# Patient Record
Sex: Female | Born: 1975 | Race: White | Hispanic: No | Smoking: Never smoker
Health system: Southern US, Community
[De-identification: ages and names within clinical notes are randomized; demographics above are authoritative.]

## PROBLEM LIST (undated history)

## (undated) DIAGNOSIS — T8859XA Other complications of anesthesia, initial encounter: Secondary | ICD-10-CM

## (undated) DIAGNOSIS — C50919 Malignant neoplasm of unspecified site of unspecified female breast: Secondary | ICD-10-CM

## (undated) DIAGNOSIS — T4145XA Adverse effect of unspecified anesthetic, initial encounter: Secondary | ICD-10-CM

## (undated) DIAGNOSIS — F419 Anxiety disorder, unspecified: Secondary | ICD-10-CM

## (undated) DIAGNOSIS — J302 Other seasonal allergic rhinitis: Secondary | ICD-10-CM

## (undated) DIAGNOSIS — K219 Gastro-esophageal reflux disease without esophagitis: Secondary | ICD-10-CM

## (undated) HISTORY — PX: WISDOM TOOTH EXTRACTION: SHX21

---
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
## 2005-09-02 HISTORY — PX: APPENDECTOMY: SHX54

## 2011-04-17 HISTORY — PX: PORTACATH PLACEMENT: SHX2246

## 2011-04-22 ENCOUNTER — Other Ambulatory Visit: Payer: Self-pay | Admitting: Oncology

## 2011-04-22 ENCOUNTER — Encounter (HOSPITAL_BASED_OUTPATIENT_CLINIC_OR_DEPARTMENT_OTHER): Payer: BC Managed Care – PPO | Admitting: Oncology

## 2011-04-22 DIAGNOSIS — C50919 Malignant neoplasm of unspecified site of unspecified female breast: Secondary | ICD-10-CM

## 2011-04-22 DIAGNOSIS — C50419 Malignant neoplasm of upper-outer quadrant of unspecified female breast: Secondary | ICD-10-CM

## 2011-04-22 LAB — COMPREHENSIVE METABOLIC PANEL
AST: 13 U/L (ref 0–37)
Albumin: 3.8 g/dL (ref 3.5–5.2)
Alkaline Phosphatase: 75 U/L (ref 39–117)
Potassium: 3.3 mEq/L — ABNORMAL LOW (ref 3.5–5.3)
Sodium: 138 mEq/L (ref 135–145)
Total Protein: 7 g/dL (ref 6.0–8.3)

## 2011-04-22 LAB — CBC WITH DIFFERENTIAL/PLATELET
EOS%: 0.7 % (ref 0.0–7.0)
Eosinophils Absolute: 0.1 10*3/uL (ref 0.0–0.5)
MCH: 30 pg (ref 25.1–34.0)
MCV: 89.4 fL (ref 79.5–101.0)
MONO%: 4.3 % (ref 0.0–14.0)
NEUT#: 6.7 10*3/uL — ABNORMAL HIGH (ref 1.5–6.5)
RBC: 4.66 10*6/uL (ref 3.70–5.45)
RDW: 13.2 % (ref 11.2–14.5)
lymph#: 1.9 10*3/uL (ref 0.9–3.3)

## 2011-05-05 ENCOUNTER — Encounter (INDEPENDENT_AMBULATORY_CARE_PROVIDER_SITE_OTHER): Payer: Self-pay | Admitting: Surgery

## 2011-05-07 ENCOUNTER — Other Ambulatory Visit: Payer: Self-pay | Admitting: Oncology

## 2011-05-07 DIAGNOSIS — C50919 Malignant neoplasm of unspecified site of unspecified female breast: Secondary | ICD-10-CM

## 2011-05-08 ENCOUNTER — Other Ambulatory Visit (INDEPENDENT_AMBULATORY_CARE_PROVIDER_SITE_OTHER): Payer: Self-pay | Admitting: Surgery

## 2011-05-08 ENCOUNTER — Encounter (HOSPITAL_COMMUNITY): Payer: BC Managed Care – PPO

## 2011-05-11 LAB — MRSA CULTURE

## 2011-05-13 ENCOUNTER — Ambulatory Visit (HOSPITAL_COMMUNITY)
Admission: RE | Admit: 2011-05-13 | Discharge: 2011-05-13 | Disposition: A | Discharge status: Home / Self Care | Payer: BC Managed Care – PPO | Source: Ambulatory Visit | Attending: Oncology | Admitting: Oncology

## 2011-05-13 ENCOUNTER — Ambulatory Visit (HOSPITAL_COMMUNITY): Payer: BC Managed Care – PPO

## 2011-05-13 ENCOUNTER — Encounter (HOSPITAL_COMMUNITY): Payer: Self-pay

## 2011-05-13 ENCOUNTER — Encounter (HOSPITAL_COMMUNITY)
Admission: RE | Admit: 2011-05-13 | Discharge: 2011-05-13 | Disposition: A | Discharge status: Home / Self Care | Payer: BC Managed Care – PPO | Source: Ambulatory Visit | Attending: Oncology | Admitting: Oncology

## 2011-05-13 DIAGNOSIS — C773 Secondary and unspecified malignant neoplasm of axilla and upper limb lymph nodes: Secondary | ICD-10-CM | POA: Insufficient documentation

## 2011-05-13 DIAGNOSIS — C50919 Malignant neoplasm of unspecified site of unspecified female breast: Secondary | ICD-10-CM | POA: Insufficient documentation

## 2011-05-13 DIAGNOSIS — Z01810 Encounter for preprocedural cardiovascular examination: Secondary | ICD-10-CM | POA: Insufficient documentation

## 2011-05-13 DIAGNOSIS — Z5111 Encounter for antineoplastic chemotherapy: Secondary | ICD-10-CM

## 2011-05-13 HISTORY — DX: Malignant neoplasm of unspecified site of unspecified female breast: C50.919

## 2011-05-13 MED ORDER — FLUDEOXYGLUCOSE F - 18 (FDG) INJECTION
15.5000 | Freq: Once | INTRAVENOUS | Status: AC | PRN
  Administered 2011-05-13: 15.5 via INTRAVENOUS

## 2011-05-13 MED ORDER — IOHEXOL 300 MG/ML  SOLN
100.0000 mL | Freq: Once | INTRAMUSCULAR | Status: AC | PRN
  Administered 2011-05-13: 100 mL via INTRAVENOUS

## 2011-05-14 ENCOUNTER — Ambulatory Visit (HOSPITAL_COMMUNITY)
Admission: RE | Admit: 2011-05-14 | Discharge: 2011-05-14 | Disposition: A | Discharge status: Home / Self Care | Payer: BC Managed Care – PPO | Source: Ambulatory Visit | Attending: Surgery | Admitting: Surgery

## 2011-05-14 ENCOUNTER — Ambulatory Visit (HOSPITAL_COMMUNITY): Payer: BC Managed Care – PPO

## 2011-05-14 DIAGNOSIS — C50219 Malignant neoplasm of upper-inner quadrant of unspecified female breast: Secondary | ICD-10-CM

## 2011-05-14 DIAGNOSIS — Z79899 Other long term (current) drug therapy: Secondary | ICD-10-CM | POA: Insufficient documentation

## 2011-05-14 DIAGNOSIS — Z01810 Encounter for preprocedural cardiovascular examination: Secondary | ICD-10-CM | POA: Insufficient documentation

## 2011-05-14 DIAGNOSIS — Z01812 Encounter for preprocedural laboratory examination: Secondary | ICD-10-CM | POA: Insufficient documentation

## 2011-05-19 ENCOUNTER — Encounter: Payer: BC Managed Care – PPO | Admitting: Oncology

## 2011-05-19 DIAGNOSIS — C50219 Malignant neoplasm of upper-inner quadrant of unspecified female breast: Secondary | ICD-10-CM

## 2011-05-25 NOTE — Op Note (Signed)
NAME:  Crystal Lane, Crystal Lane NO.:  [id number]  MEDICAL RECORD NO.:  [id number]  LOCATION:  DAYL                         FACILITY:  [facility]  PHYSICIAN:  Sandria Bales. Ezzard Standing, M.D.  DATE OF BIRTH:  1976-04-27  DATE OF PROCEDURE:  05/14/2011                              OPERATIVE REPORT   PREOPERATIVE DIAGNOSIS:  Upper inner quadrant left breast cancer.  POSTOPERATIVE DIAGNOSIS:  Upper inner quadrant left breast cancer.  PROCEDURE:  Right subclavian Power Port placement.  SURGEON:  Sandria Bales. Ezzard Standing, M.D.  FIRST ASSISTANT:  None.  ANESTHESIA:  MAC with about 33 cc of 1% Xylocaine.  COMPLICATIONS:  None.  INDICATIONS FOR PROCEDURE:  Ms. 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  The tumor measures about 4.3 cm on MRI. She is also BRCA1 positive.  The patient wants to try breast conservation therapy.  She has seen Dr. Ruthann Cancer and is planning neoadjuvant chemotherapy supervised by Dr. Ricarda Frame. She is here today for placement of a Port-A-Cath for IV access.  The indications and potential risks of surgery were explained to the patient.  Potential risks include, but are not limited to, bleeding, infection, nerve injury, pneumothorax and thrombosis of the vein.  OPERATIVE NOTE:  The patient was placed in a supine position with a roll under her back.  She was given 1 g of Ancef initially for procedure.  She underwent MAC anesthesia.  Her chest and neck were prepped with ChloraPrep and sterilely draped.    Time-out was held and surgical checklist run.  I accessed the right subclavian vein with a 16 gauge needle on the third stick.  I threaded a guidewire through this into the superior vena cava. I checked the position with fluoroscopy.  I then developed a pocket in the upper inner quadrant of her right breast.  We actually marked her breast preoperatively for  cosmetic reasons.  I tried to follow this preop Marcaine.  I made a pocket for the PowerPort.  I inserted a Bard PowerPort.  I put the reservoir up in the upper inner part of the left breast.  The PowerPort reference number is [id number], lot number [id number].  This was sewn in place with a 3-0 Vicryl suture.  I then threaded the silastic tubing from the upper inner quadrant of the right breast down to the right subclavian stick site and introduced it into the subclavian vein using a 8-French introducer.  I positioned the tip of the junction in the superior vena cava right atrium under fluoroscopy.  I then attached the tubing to the reservoir, flushed the entire unit with about 10 cc of dilute heparin saline (10 units/cc).  I checked position again of the tubing and the tip which all looked good and then flushed with 5 cc of 100 units/cc of concentrated heparin.  The patient tolerated the procedure well.  I then irrigated the wound, closed the subcutaneous tissues with 3-0 Vicryl suture and skin with a 5- 0 running subcutaneous suture and right subclavian stick site with a 5-0 Vicryl and painted with tincture of benzoin and steri-stripped it  with 0.25-inch Steri-Strips.    The patient tolerated the procedure well, was transported to recovery room in good condition.  Chest x-ray is pending at the time of this dictation.   Sandria Bales. Ezzard Standing, M.D., FACS   DHN/MEDQ  D:  05/14/2011  T:  05/14/2011  Job:  [id number]  cc:   Lowella Dell, M.D. Fax: [phone]  Maryln Gottron, M.D. Fax: [phone]  Fredric Dine  Electronically Signed by Ovidio Kin M.D. on 05/25/2011 07:23:56 AM

## 2011-05-26 ENCOUNTER — Encounter (INDEPENDENT_AMBULATORY_CARE_PROVIDER_SITE_OTHER): Payer: Self-pay | Admitting: Surgery

## 2011-05-27 ENCOUNTER — Encounter (INDEPENDENT_AMBULATORY_CARE_PROVIDER_SITE_OTHER): Payer: BC Managed Care – PPO | Admitting: Surgery

## 2011-06-03 ENCOUNTER — Other Ambulatory Visit: Payer: Self-pay | Admitting: Oncology

## 2011-06-03 ENCOUNTER — Encounter (HOSPITAL_BASED_OUTPATIENT_CLINIC_OR_DEPARTMENT_OTHER): Payer: Self-pay | Admitting: Oncology

## 2011-06-03 DIAGNOSIS — Z5111 Encounter for antineoplastic chemotherapy: Secondary | ICD-10-CM

## 2011-06-03 DIAGNOSIS — C50919 Malignant neoplasm of unspecified site of unspecified female breast: Secondary | ICD-10-CM

## 2011-06-03 DIAGNOSIS — C50419 Malignant neoplasm of upper-outer quadrant of unspecified female breast: Secondary | ICD-10-CM

## 2011-06-03 LAB — CBC WITH DIFFERENTIAL/PLATELET
BASO%: 0.4 % (ref 0.0–2.0)
EOS%: 0.5 % (ref 0.0–7.0)
HCT: 41.4 % (ref 34.8–46.6)
LYMPH%: 19.4 % (ref 14.0–49.7)
MCH: 30 pg (ref 25.1–34.0)
MCHC: 34.3 g/dL (ref 31.5–36.0)
MONO%: 6.4 % (ref 0.0–14.0)
NEUT%: 73.3 % (ref 38.4–76.8)
Platelets: 230 10*3/uL (ref 145–400)

## 2011-06-03 LAB — COMPREHENSIVE METABOLIC PANEL
ALT: 11 U/L (ref 0–35)
AST: 14 U/L (ref 0–37)
Albumin: 4 g/dL (ref 3.5–5.2)
Alkaline Phosphatase: 60 U/L (ref 39–117)
BUN: 18 mg/dL (ref 6–23)
CO2: 23 mEq/L (ref 19–32)
Calcium: 8.8 mg/dL (ref 8.4–10.5)
Chloride: 104 mEq/L (ref 96–112)
Creatinine, Ser: 0.63 mg/dL (ref 0.50–1.10)
Glucose, Bld: 91 mg/dL (ref 70–99)
Potassium: 4.1 mEq/L (ref 3.5–5.3)
Sodium: 137 mEq/L (ref 135–145)
Total Bilirubin: 0.4 mg/dL (ref 0.3–1.2)
Total Protein: 6.8 g/dL (ref 6.0–8.3)

## 2011-06-04 ENCOUNTER — Encounter (HOSPITAL_BASED_OUTPATIENT_CLINIC_OR_DEPARTMENT_OTHER): Payer: BC Managed Care – PPO | Admitting: Oncology

## 2011-06-04 DIAGNOSIS — C50419 Malignant neoplasm of upper-outer quadrant of unspecified female breast: Secondary | ICD-10-CM

## 2011-06-09 ENCOUNTER — Other Ambulatory Visit: Payer: Self-pay | Admitting: Oncology

## 2011-06-09 ENCOUNTER — Encounter (HOSPITAL_BASED_OUTPATIENT_CLINIC_OR_DEPARTMENT_OTHER): Payer: BC Managed Care – PPO | Admitting: Oncology

## 2011-06-09 DIAGNOSIS — C50219 Malignant neoplasm of upper-inner quadrant of unspecified female breast: Secondary | ICD-10-CM

## 2011-06-09 DIAGNOSIS — C50912 Malignant neoplasm of unspecified site of left female breast: Secondary | ICD-10-CM

## 2011-06-09 DIAGNOSIS — C50919 Malignant neoplasm of unspecified site of unspecified female breast: Secondary | ICD-10-CM

## 2011-06-09 LAB — CBC WITH DIFFERENTIAL/PLATELET
BASO%: 0.2 % (ref 0.0–2.0)
Eosinophils Absolute: 0.1 10*3/uL (ref 0.0–0.5)
LYMPH%: 16.1 % (ref 14.0–49.7)
MCHC: 34 g/dL (ref 31.5–36.0)
MONO#: 0.1 10*3/uL (ref 0.1–0.9)
MONO%: 1.2 % (ref 0.0–14.0)
NEUT#: 3.5 10*3/uL (ref 1.5–6.5)
Platelets: 159 10*3/uL (ref 145–400)
RBC: 4.56 10*6/uL (ref 3.70–5.45)
RDW: 12.6 % (ref 11.2–14.5)
WBC: 4.3 10*3/uL (ref 3.9–10.3)
nRBC: 0 % (ref 0–0)

## 2011-06-16 ENCOUNTER — Encounter (HOSPITAL_BASED_OUTPATIENT_CLINIC_OR_DEPARTMENT_OTHER): Payer: BC Managed Care – PPO | Admitting: Oncology

## 2011-06-16 ENCOUNTER — Other Ambulatory Visit: Payer: Self-pay | Admitting: Oncology

## 2011-06-16 DIAGNOSIS — Z5111 Encounter for antineoplastic chemotherapy: Secondary | ICD-10-CM

## 2011-06-16 DIAGNOSIS — C50419 Malignant neoplasm of upper-outer quadrant of unspecified female breast: Secondary | ICD-10-CM

## 2011-06-16 DIAGNOSIS — R110 Nausea: Secondary | ICD-10-CM

## 2011-06-16 DIAGNOSIS — C50919 Malignant neoplasm of unspecified site of unspecified female breast: Secondary | ICD-10-CM

## 2011-06-16 DIAGNOSIS — R5381 Other malaise: Secondary | ICD-10-CM

## 2011-06-16 LAB — CBC WITH DIFFERENTIAL/PLATELET
BASO%: 0.6 % (ref 0.0–2.0)
EOS%: 0.1 % (ref 0.0–7.0)
HGB: 12.9 g/dL (ref 11.6–15.9)
MCH: 29.7 pg (ref 25.1–34.0)
MCHC: 34.1 g/dL (ref 31.5–36.0)
MONO#: 0.7 10*3/uL (ref 0.1–0.9)
RDW: 13 % (ref 11.2–14.5)
WBC: 8 10*3/uL (ref 3.9–10.3)
lymph#: 1.6 10*3/uL (ref 0.9–3.3)

## 2011-06-17 ENCOUNTER — Encounter (HOSPITAL_BASED_OUTPATIENT_CLINIC_OR_DEPARTMENT_OTHER): Payer: BC Managed Care – PPO | Admitting: Oncology

## 2011-06-17 DIAGNOSIS — C50419 Malignant neoplasm of upper-outer quadrant of unspecified female breast: Secondary | ICD-10-CM

## 2011-06-23 ENCOUNTER — Other Ambulatory Visit: Payer: Self-pay | Admitting: Oncology

## 2011-06-23 ENCOUNTER — Encounter (HOSPITAL_BASED_OUTPATIENT_CLINIC_OR_DEPARTMENT_OTHER): Payer: BC Managed Care – PPO | Admitting: Oncology

## 2011-06-23 DIAGNOSIS — C50219 Malignant neoplasm of upper-inner quadrant of unspecified female breast: Secondary | ICD-10-CM

## 2011-06-23 DIAGNOSIS — C50919 Malignant neoplasm of unspecified site of unspecified female breast: Secondary | ICD-10-CM

## 2011-06-23 LAB — CBC WITH DIFFERENTIAL/PLATELET
Eosinophils Absolute: 0 10*3/uL (ref 0.0–0.5)
HGB: 14.1 g/dL (ref 11.6–15.9)
MONO#: 0.2 10*3/uL (ref 0.1–0.9)
NEUT#: 2.3 10*3/uL (ref 1.5–6.5)
RBC: 4.8 10*6/uL (ref 3.70–5.45)
RDW: 13.1 % (ref 11.2–14.5)
WBC: 3 10*3/uL — ABNORMAL LOW (ref 3.9–10.3)
lymph#: 0.6 10*3/uL — ABNORMAL LOW (ref 0.9–3.3)
nRBC: 0 % (ref 0–0)

## 2011-06-30 ENCOUNTER — Encounter (HOSPITAL_BASED_OUTPATIENT_CLINIC_OR_DEPARTMENT_OTHER): Payer: BC Managed Care – PPO | Admitting: Oncology

## 2011-06-30 ENCOUNTER — Other Ambulatory Visit: Payer: Self-pay | Admitting: Oncology

## 2011-06-30 DIAGNOSIS — C50219 Malignant neoplasm of upper-inner quadrant of unspecified female breast: Secondary | ICD-10-CM

## 2011-06-30 DIAGNOSIS — Z5111 Encounter for antineoplastic chemotherapy: Secondary | ICD-10-CM

## 2011-06-30 DIAGNOSIS — C50919 Malignant neoplasm of unspecified site of unspecified female breast: Secondary | ICD-10-CM

## 2011-06-30 LAB — CBC WITH DIFFERENTIAL/PLATELET
BASO%: 0.8 % (ref 0.0–2.0)
Basophils Absolute: 0.1 10e3/uL (ref 0.0–0.1)
EOS%: 0.1 % (ref 0.0–7.0)
Eosinophils Absolute: 0 10e3/uL (ref 0.0–0.5)
HCT: 38.5 % (ref 34.8–46.6)
HGB: 13 g/dL (ref 11.6–15.9)
LYMPH%: 12.4 % — ABNORMAL LOW (ref 14.0–49.7)
MCH: 29.7 pg (ref 25.1–34.0)
MCHC: 33.8 g/dL (ref 31.5–36.0)
MCV: 88.1 fL (ref 79.5–101.0)
MONO#: 0.8 10e3/uL (ref 0.1–0.9)
MONO%: 7.3 % (ref 0.0–14.0)
NEUT#: 8.5 10e3/uL — ABNORMAL HIGH (ref 1.5–6.5)
NEUT%: 79.4 % — ABNORMAL HIGH (ref 38.4–76.8)
Platelets: 178 10e3/uL (ref 145–400)
RBC: 4.37 10e6/uL (ref 3.70–5.45)
RDW: 13.6 % (ref 11.2–14.5)
WBC: 10.7 10e3/uL — ABNORMAL HIGH (ref 3.9–10.3)
lymph#: 1.3 10e3/uL (ref 0.9–3.3)
nRBC: 0 % (ref 0–0)

## 2011-07-01 ENCOUNTER — Encounter (HOSPITAL_BASED_OUTPATIENT_CLINIC_OR_DEPARTMENT_OTHER): Payer: BC Managed Care – PPO | Admitting: Oncology

## 2011-07-01 DIAGNOSIS — C50419 Malignant neoplasm of upper-outer quadrant of unspecified female breast: Secondary | ICD-10-CM

## 2011-07-07 ENCOUNTER — Encounter (HOSPITAL_BASED_OUTPATIENT_CLINIC_OR_DEPARTMENT_OTHER): Payer: BC Managed Care – PPO | Admitting: Oncology

## 2011-07-07 ENCOUNTER — Other Ambulatory Visit: Payer: Self-pay | Admitting: Oncology

## 2011-07-07 DIAGNOSIS — C50919 Malignant neoplasm of unspecified site of unspecified female breast: Secondary | ICD-10-CM

## 2011-07-07 DIAGNOSIS — C50419 Malignant neoplasm of upper-outer quadrant of unspecified female breast: Secondary | ICD-10-CM

## 2011-07-07 LAB — CBC WITH DIFFERENTIAL/PLATELET
Basophils Absolute: 0 10*3/uL (ref 0.0–0.1)
Eosinophils Absolute: 0 10*3/uL (ref 0.0–0.5)
HGB: 12.7 g/dL (ref 11.6–15.9)
MCV: 87.1 fL (ref 79.5–101.0)
MONO%: 2.2 % (ref 0.0–14.0)
NEUT#: 3.3 10*3/uL (ref 1.5–6.5)
RDW: 13.3 % (ref 11.2–14.5)
lymph#: 0.4 10*3/uL — ABNORMAL LOW (ref 0.9–3.3)

## 2011-07-14 ENCOUNTER — Encounter (HOSPITAL_BASED_OUTPATIENT_CLINIC_OR_DEPARTMENT_OTHER): Payer: BC Managed Care – PPO | Admitting: Oncology

## 2011-07-14 ENCOUNTER — Other Ambulatory Visit: Payer: Self-pay | Admitting: Oncology

## 2011-07-14 DIAGNOSIS — C50919 Malignant neoplasm of unspecified site of unspecified female breast: Secondary | ICD-10-CM

## 2011-07-14 DIAGNOSIS — Z5111 Encounter for antineoplastic chemotherapy: Secondary | ICD-10-CM

## 2011-07-14 DIAGNOSIS — C50219 Malignant neoplasm of upper-inner quadrant of unspecified female breast: Secondary | ICD-10-CM

## 2011-07-14 LAB — CBC WITH DIFFERENTIAL/PLATELET
Basophils Absolute: 0 10*3/uL (ref 0.0–0.1)
Eosinophils Absolute: 0 10*3/uL (ref 0.0–0.5)
HGB: 11.9 g/dL (ref 11.6–15.9)
LYMPH%: 16.5 % (ref 14.0–49.7)
MCV: 86.3 fL (ref 79.5–101.0)
MONO%: 11.9 % (ref 0.0–14.0)
NEUT#: 4.3 10*3/uL (ref 1.5–6.5)
NEUT%: 70.9 % (ref 38.4–76.8)
Platelets: 236 10*3/uL (ref 145–400)

## 2011-07-15 ENCOUNTER — Encounter (HOSPITAL_BASED_OUTPATIENT_CLINIC_OR_DEPARTMENT_OTHER): Payer: BC Managed Care – PPO | Admitting: Oncology

## 2011-07-15 DIAGNOSIS — Z5189 Encounter for other specified aftercare: Secondary | ICD-10-CM

## 2011-07-15 DIAGNOSIS — C50219 Malignant neoplasm of upper-inner quadrant of unspecified female breast: Secondary | ICD-10-CM

## 2011-07-21 ENCOUNTER — Encounter: Payer: BC Managed Care – PPO | Admitting: Oncology

## 2011-07-21 ENCOUNTER — Ambulatory Visit
Admission: RE | Admit: 2011-07-21 | Discharge: 2011-07-21 | Disposition: A | Discharge status: Home / Self Care | Payer: BC Managed Care – PPO | Source: Ambulatory Visit | Attending: Oncology | Admitting: Oncology

## 2011-07-21 DIAGNOSIS — C50912 Malignant neoplasm of unspecified site of left female breast: Secondary | ICD-10-CM

## 2011-07-21 MED ORDER — GADOBENATE DIMEGLUMINE 529 MG/ML IV SOLN
14.0000 mL | Freq: Once | INTRAVENOUS | Status: AC | PRN
  Administered 2011-07-21: 14 mL via INTRAVENOUS

## 2011-07-22 ENCOUNTER — Ambulatory Visit (INDEPENDENT_AMBULATORY_CARE_PROVIDER_SITE_OTHER): Payer: Self-pay | Admitting: Surgery

## 2011-07-22 ENCOUNTER — Other Ambulatory Visit: Payer: Self-pay | Admitting: Oncology

## 2011-07-22 ENCOUNTER — Other Ambulatory Visit (INDEPENDENT_AMBULATORY_CARE_PROVIDER_SITE_OTHER): Payer: Self-pay | Admitting: Surgery

## 2011-07-22 ENCOUNTER — Encounter (HOSPITAL_BASED_OUTPATIENT_CLINIC_OR_DEPARTMENT_OTHER): Payer: BC Managed Care – PPO | Admitting: Oncology

## 2011-07-22 ENCOUNTER — Encounter (INDEPENDENT_AMBULATORY_CARE_PROVIDER_SITE_OTHER): Payer: Self-pay | Admitting: Surgery

## 2011-07-22 VITALS — BP 106/76 | HR 60 | Temp 96.8°F | Ht 62.0 in | Wt 168.1 lb

## 2011-07-22 DIAGNOSIS — C50919 Malignant neoplasm of unspecified site of unspecified female breast: Secondary | ICD-10-CM

## 2011-07-22 DIAGNOSIS — C50212 Malignant neoplasm of upper-inner quadrant of left female breast: Secondary | ICD-10-CM | POA: Insufficient documentation

## 2011-07-22 DIAGNOSIS — Z79899 Other long term (current) drug therapy: Secondary | ICD-10-CM

## 2011-07-22 DIAGNOSIS — C50419 Malignant neoplasm of upper-outer quadrant of unspecified female breast: Secondary | ICD-10-CM

## 2011-07-22 DIAGNOSIS — N63 Unspecified lump in unspecified breast: Secondary | ICD-10-CM

## 2011-07-22 DIAGNOSIS — R42 Dizziness and giddiness: Secondary | ICD-10-CM

## 2011-07-22 DIAGNOSIS — R5381 Other malaise: Secondary | ICD-10-CM

## 2011-07-22 LAB — CBC WITH DIFFERENTIAL/PLATELET
BASO%: 0.5 % (ref 0.0–2.0)
EOS%: 0.1 % (ref 0.0–7.0)
HCT: 34.5 % — ABNORMAL LOW (ref 34.8–46.6)
LYMPH%: 13 % — ABNORMAL LOW (ref 14.0–49.7)
MCH: 30.6 pg (ref 25.1–34.0)
MCHC: 34.8 g/dL (ref 31.5–36.0)
NEUT%: 78.5 % — ABNORMAL HIGH (ref 38.4–76.8)
Platelets: 214 10*3/uL (ref 145–400)

## 2011-07-22 LAB — COMPREHENSIVE METABOLIC PANEL
ALT: 10 U/L (ref 0–35)
AST: 9 U/L (ref 0–37)
BUN: 16 mg/dL (ref 6–23)
CO2: 27 mEq/L (ref 19–32)
Creatinine, Ser: 0.61 mg/dL (ref 0.50–1.10)
Total Bilirubin: 0.2 mg/dL — ABNORMAL LOW (ref 0.3–1.2)

## 2011-07-22 NOTE — Progress Notes (Addendum)
ASSESSMENT AND PLAN: 1.  Left breast cancer, upper inner quadrant.    T2, N0.   BRCA 1 positive.  Triple Negative.  Getting neoadjuvant therapy by Crystal Lane.  Anticipate completion with Taxol.  I talked to Crystal Lane today.  Tumor size decreased by 1/3.  Patient never had a clip placed in her breast.  She has had such a good response to chemotherapy, I think a clip placement is important in case she gets a CR.  Discussed with patient.   Crystal Lane called me and said there was some confusion about the clip placement.  I called and spoke to the patient.  Crystal Lane 07/24/11]  I will see her back at the end of the next round of chemotx to plan lumpectomy and SLNBx.  2.  Power port placed 05/14/2011.  HISTORY OF PRESENT ILLNESS:  Crystal Lane is a 35 y.o. (DOB: 10-03-1976)  white female who is a patient of Crystal Lane,Crystal Lane, Crystal Lane and comes to me today for followup of neoadjuvant chemotherapy for a left breast cancer.  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 She originally saw Crystal Lane for oncology, Crystal Lane of [facility] for surgery, and Crystal Lane for radiation oncology.  But she decided to have her care in [place] and has been seen by our medical community.  She has seen Crystal Lane for medical oncology and Crystal Lane for radiation oncology.  She tried to harvest some ovaries with the assistance of Crystal Lane in June 2012, but this did not work.  The tumor originally measured about 4.3 cm on MRI dated 04/01/2011. She is also BRCA1 positive. The patient wants to try breast conservation therapy.  She originally underwent a biopsy 03/27/2011 which showed Invasive ductal carcinoma read by Crystal Lane.  The tumor is triple negative.  She is accompanied with her mom today.  She has completed 4 cycles of AC.  She can't feel the tumor  anymore.  PHYSICAL EXAM: BP 106/76  Pulse 60  Temp 96.8 F (36 C)  Ht 5\' 2"  (1.575 m)  Wt 168 lb 2 oz (76.261 kg)  BMI 30.75 kg/m2  HEENT:  Pupils equal.  Dentition good.  No injury. NECK:  Supple.  No thyroid mass. LYMPH NODES:  No cervical, supraclavicular, or axillary adenopathy. BREASTS -  RIGHT:  No palpable mass or nodule.  No nipple discharge.  Port in upper inner right breast.   LEFT:  Left breast tumor no longer palpable.  There may be some minimal thickening in the upper inner quadrant. No nipple discharge. UPPER EXTREMITIES:  No evidence of lymphedema.  Ultrasound in office:  Residual 1.3 x 1.0 cm tumor in UIQ of left breast.  Patient saw pictures.  DATA REVIEWED: MRI images reviewed, but report pending.  I have a call into Crystal Lane.

## 2011-07-22 NOTE — Patient Instructions (Signed)
See me back at the end of the next round of chemotherapy.

## 2011-07-27 ENCOUNTER — Ambulatory Visit
Admission: RE | Admit: 2011-07-27 | Discharge: 2011-07-27 | Disposition: A | Discharge status: Home / Self Care | Payer: BC Managed Care – PPO | Source: Ambulatory Visit | Attending: Surgery | Admitting: Surgery

## 2011-07-27 DIAGNOSIS — N63 Unspecified lump in unspecified breast: Secondary | ICD-10-CM

## 2011-08-07 ENCOUNTER — Encounter (HOSPITAL_BASED_OUTPATIENT_CLINIC_OR_DEPARTMENT_OTHER): Payer: BC Managed Care – PPO | Admitting: Oncology

## 2011-08-07 ENCOUNTER — Other Ambulatory Visit: Payer: Self-pay | Admitting: Oncology

## 2011-08-07 DIAGNOSIS — C50419 Malignant neoplasm of upper-outer quadrant of unspecified female breast: Secondary | ICD-10-CM

## 2011-08-07 DIAGNOSIS — R5381 Other malaise: Secondary | ICD-10-CM

## 2011-08-07 DIAGNOSIS — R110 Nausea: Secondary | ICD-10-CM

## 2011-08-07 DIAGNOSIS — C50219 Malignant neoplasm of upper-inner quadrant of unspecified female breast: Secondary | ICD-10-CM

## 2011-08-07 DIAGNOSIS — Z5111 Encounter for antineoplastic chemotherapy: Secondary | ICD-10-CM

## 2011-08-07 LAB — CBC WITH DIFFERENTIAL/PLATELET
BASO%: 0.2 % (ref 0.0–2.0)
Basophils Absolute: 0 10*3/uL (ref 0.0–0.1)
Eosinophils Absolute: 0 10*3/uL (ref 0.0–0.5)
HCT: 34.3 % — ABNORMAL LOW (ref 34.8–46.6)
HGB: 11.6 g/dL (ref 11.6–15.9)
LYMPH%: 12.3 % — ABNORMAL LOW (ref 14.0–49.7)
MCHC: 33.8 g/dL (ref 31.5–36.0)
MONO#: 0.8 10*3/uL (ref 0.1–0.9)
NEUT%: 71.6 % (ref 38.4–76.8)
Platelets: 314 10*3/uL (ref 145–400)
WBC: 5.1 10*3/uL (ref 3.9–10.3)

## 2011-08-13 ENCOUNTER — Other Ambulatory Visit: Payer: Self-pay | Admitting: Oncology

## 2011-08-13 ENCOUNTER — Encounter (HOSPITAL_BASED_OUTPATIENT_CLINIC_OR_DEPARTMENT_OTHER): Payer: BC Managed Care – PPO | Admitting: Oncology

## 2011-08-13 DIAGNOSIS — Z5111 Encounter for antineoplastic chemotherapy: Secondary | ICD-10-CM

## 2011-08-13 DIAGNOSIS — C50419 Malignant neoplasm of upper-outer quadrant of unspecified female breast: Secondary | ICD-10-CM

## 2011-08-13 DIAGNOSIS — R5383 Other fatigue: Secondary | ICD-10-CM

## 2011-08-13 DIAGNOSIS — C50919 Malignant neoplasm of unspecified site of unspecified female breast: Secondary | ICD-10-CM

## 2011-08-13 DIAGNOSIS — R6889 Other general symptoms and signs: Secondary | ICD-10-CM

## 2011-08-13 LAB — CBC WITH DIFFERENTIAL/PLATELET
BASO%: 0.8 % (ref 0.0–2.0)
Basophils Absolute: 0.1 10*3/uL (ref 0.0–0.1)
EOS%: 0.8 % (ref 0.0–7.0)
HCT: 33.6 % — ABNORMAL LOW (ref 34.8–46.6)
LYMPH%: 12.7 % — ABNORMAL LOW (ref 14.0–49.7)
MCH: 30.4 pg (ref 25.1–34.0)
MCHC: 34.2 g/dL (ref 31.5–36.0)
MCV: 88.9 fL (ref 79.5–101.0)
MONO%: 6.3 % (ref 0.0–14.0)
NEUT%: 79.4 % — ABNORMAL HIGH (ref 38.4–76.8)
Platelets: 287 10*3/uL (ref 145–400)

## 2011-08-14 ENCOUNTER — Encounter (HOSPITAL_BASED_OUTPATIENT_CLINIC_OR_DEPARTMENT_OTHER): Payer: BC Managed Care – PPO | Admitting: Oncology

## 2011-08-14 DIAGNOSIS — Z5111 Encounter for antineoplastic chemotherapy: Secondary | ICD-10-CM

## 2011-08-14 DIAGNOSIS — C50219 Malignant neoplasm of upper-inner quadrant of unspecified female breast: Secondary | ICD-10-CM

## 2011-08-20 ENCOUNTER — Other Ambulatory Visit: Payer: Self-pay | Admitting: Oncology

## 2011-08-20 ENCOUNTER — Encounter (HOSPITAL_BASED_OUTPATIENT_CLINIC_OR_DEPARTMENT_OTHER): Payer: BC Managed Care – PPO | Admitting: Oncology

## 2011-08-20 DIAGNOSIS — R110 Nausea: Secondary | ICD-10-CM

## 2011-08-20 DIAGNOSIS — R5381 Other malaise: Secondary | ICD-10-CM

## 2011-08-20 DIAGNOSIS — C50419 Malignant neoplasm of upper-outer quadrant of unspecified female breast: Secondary | ICD-10-CM

## 2011-08-20 DIAGNOSIS — Z5111 Encounter for antineoplastic chemotherapy: Secondary | ICD-10-CM

## 2011-08-20 LAB — CBC WITH DIFFERENTIAL/PLATELET
EOS%: 1.2 % (ref 0.0–7.0)
LYMPH%: 18 % (ref 14.0–49.7)
MCH: 30.9 pg (ref 25.1–34.0)
MCV: 90.3 fL (ref 79.5–101.0)
MONO%: 5.4 % (ref 0.0–14.0)
Platelets: 294 10*3/uL (ref 145–400)
RBC: 3.49 10*6/uL — ABNORMAL LOW (ref 3.70–5.45)
RDW: 16.2 % — ABNORMAL HIGH (ref 11.2–14.5)
nRBC: 0 % (ref 0–0)

## 2011-08-27 ENCOUNTER — Other Ambulatory Visit: Payer: Self-pay | Admitting: Oncology

## 2011-08-27 ENCOUNTER — Encounter (HOSPITAL_BASED_OUTPATIENT_CLINIC_OR_DEPARTMENT_OTHER): Payer: BC Managed Care – PPO | Admitting: Oncology

## 2011-08-27 DIAGNOSIS — G620 Drug-induced polyneuropathy: Secondary | ICD-10-CM

## 2011-08-27 DIAGNOSIS — T451X5A Adverse effect of antineoplastic and immunosuppressive drugs, initial encounter: Secondary | ICD-10-CM

## 2011-08-27 DIAGNOSIS — R110 Nausea: Secondary | ICD-10-CM

## 2011-08-27 DIAGNOSIS — Z5111 Encounter for antineoplastic chemotherapy: Secondary | ICD-10-CM

## 2011-08-27 DIAGNOSIS — C50219 Malignant neoplasm of upper-inner quadrant of unspecified female breast: Secondary | ICD-10-CM

## 2011-08-27 DIAGNOSIS — R5381 Other malaise: Secondary | ICD-10-CM

## 2011-08-27 DIAGNOSIS — Z79899 Other long term (current) drug therapy: Secondary | ICD-10-CM

## 2011-08-27 DIAGNOSIS — C50419 Malignant neoplasm of upper-outer quadrant of unspecified female breast: Secondary | ICD-10-CM

## 2011-08-27 LAB — CBC WITH DIFFERENTIAL/PLATELET
BASO%: 0.7 % (ref 0.0–2.0)
EOS%: 1.5 % (ref 0.0–7.0)
HCT: 30.9 % — ABNORMAL LOW (ref 34.8–46.6)
LYMPH%: 18.9 % (ref 14.0–49.7)
MCH: 31.3 pg (ref 25.1–34.0)
MCHC: 34.3 g/dL (ref 31.5–36.0)
MONO#: 0.2 10*3/uL (ref 0.1–0.9)
NEUT%: 74.1 % (ref 38.4–76.8)
Platelets: 325 10*3/uL (ref 145–400)
RBC: 3.39 10*6/uL — ABNORMAL LOW (ref 3.70–5.45)
WBC: 4.6 10*3/uL (ref 3.9–10.3)
lymph#: 0.9 10*3/uL (ref 0.9–3.3)
nRBC: 0 % (ref 0–0)

## 2011-09-03 ENCOUNTER — Encounter (HOSPITAL_BASED_OUTPATIENT_CLINIC_OR_DEPARTMENT_OTHER): Payer: BC Managed Care – PPO | Admitting: Oncology

## 2011-09-03 ENCOUNTER — Other Ambulatory Visit: Payer: Self-pay | Admitting: Oncology

## 2011-09-03 DIAGNOSIS — R110 Nausea: Secondary | ICD-10-CM

## 2011-09-03 DIAGNOSIS — C50419 Malignant neoplasm of upper-outer quadrant of unspecified female breast: Secondary | ICD-10-CM

## 2011-09-03 DIAGNOSIS — R5381 Other malaise: Secondary | ICD-10-CM

## 2011-09-03 DIAGNOSIS — Z5111 Encounter for antineoplastic chemotherapy: Secondary | ICD-10-CM

## 2011-09-03 DIAGNOSIS — G609 Hereditary and idiopathic neuropathy, unspecified: Secondary | ICD-10-CM

## 2011-09-03 LAB — CBC WITH DIFFERENTIAL/PLATELET
BASO%: 0.9 % (ref 0.0–2.0)
EOS%: 1.1 % (ref 0.0–7.0)
HGB: 11.9 g/dL (ref 11.6–15.9)
MCH: 31.2 pg (ref 25.1–34.0)
MCV: 92.9 fL (ref 79.5–101.0)
MONO%: 11.1 % (ref 0.0–14.0)
RBC: 3.82 10*6/uL (ref 3.70–5.45)
RDW: 15.7 % — ABNORMAL HIGH (ref 11.2–14.5)
lymph#: 0.9 10*3/uL (ref 0.9–3.3)
nRBC: 0 % (ref 0–0)

## 2011-09-04 ENCOUNTER — Other Ambulatory Visit: Payer: Self-pay | Admitting: Oncology

## 2011-09-04 DIAGNOSIS — C50912 Malignant neoplasm of unspecified site of left female breast: Secondary | ICD-10-CM

## 2011-09-09 ENCOUNTER — Encounter (INDEPENDENT_AMBULATORY_CARE_PROVIDER_SITE_OTHER): Payer: Self-pay

## 2011-09-11 ENCOUNTER — Encounter (INDEPENDENT_AMBULATORY_CARE_PROVIDER_SITE_OTHER): Payer: Self-pay | Admitting: Surgery

## 2011-09-11 ENCOUNTER — Ambulatory Visit (INDEPENDENT_AMBULATORY_CARE_PROVIDER_SITE_OTHER): Payer: BC Managed Care – PPO | Admitting: Surgery

## 2011-09-11 VITALS — BP 110/74 | HR 72 | Temp 97.6°F | Resp 16 | Ht 62.0 in | Wt 179.4 lb

## 2011-09-11 DIAGNOSIS — C50919 Malignant neoplasm of unspecified site of unspecified female breast: Secondary | ICD-10-CM

## 2011-09-11 NOTE — Progress Notes (Addendum)
ASSESSMENT AND PLAN: 1.  Left breast cancer, upper inner quadrant.    T2, N0.   BRCA 1 positive.  Triple Negative.  Note, on [place], the tumor looks close to the chest wall.  The clip may be a little more superficial.  Finished neoadjuvant therapy by Dr. Darnelle Lane.  Developed neuropathy with Taxol.  I talked to Dr. Darnelle Lane today.   She still has numbness from Taxol.  Tumor size decreased by 1/3.  Clip placed in left breast - 07/27/2011.   I have discussed the options for breast cancer treatment with the patient.  The patient is ready for lumpectomy, SLNBx, and power port removal. I discussed needle localization, injection for SLNBx.  At first she did not want any clips left in her breast, but she said it is okay if I leave marking clips for radiation oncology.  She had some trouble with the skin prep and nausea last time. The risks of surgery include, but are not limited to, bleeding, infection, the need for further surgery, and nerve injury.  She is accompanied with her mother.  The patient has been given literature on the treatment of breast cancer.  2.  Power port placed 05/14/2011.  Patient will do what we want.  [I talked to Dr Crystal Lane and we think it is best to leave the port until the final path is in.  DN 11/16/2011] 3.  BRCA 1 positive.  Wants to try breast conservation. 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  5.  Has started fund raising with another patient of mine, Crystal Lane [phone]).  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  HISTORY OF PRESENT ILLNESS:  Crystal Lane is a 35 y.o. (DOB: July 17, 1976)  white female who is a patient of JUDGE,ERIN, FNP, FNP and comes to me today for followup of neoadjuvant chemotherapy for a left breast cancer.  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 She originally saw Dr. Mathis Lane for oncology, Dr. Gennette Lane of [facility] for  surgery, and Dr. Graciela Lane for radiation oncology.  But she decided to have her care in [place] and has been seen by our medical community.  She has seen Dr. Patrice Lane for medical oncology and Dr. Jamie Lane for radiation oncology.  She tried to harvest some ovaries with the assistance of Dr. Melvenia Lane in June 2012, but this did not work.  The tumor originally measured about 4.3 cm on MRI dated 04/01/2011. She is also BRCA1 positive. The patient wants to try breast conservation therapy.  She originally underwent a biopsy 03/27/2011 which showed Invasive ductal carcinoma read by Dr. Katheran Lane.  The tumor is triple negative.  She is accompanied with her mom today.  She has finished chemotx (Taxol).  She stopped prematurely because of worsening neuropathy.  She is ready for surgery.  She can't feel the tumor anymore.  She discussed with me some of her efforts at fund raising for breast cancer.  PHYSICAL EXAM: BP 110/74  Pulse 72  Temp(Src) 97.6 F (36.4 C) (Temporal)  Resp 16  Ht 5\' 2"  (1.575 m)  Wt 179 lb 6.4 oz (81.375 kg)  BMI 32.81 kg/m2  HEENT:  Pupils equal.  Dentition good.  No injury. NECK:  Supple.  No thyroid mass. LYMPH NODES:  No cervical, supraclavicular, or axillary adenopathy. BREASTS -  RIGHT:  No palpable mass or nodule.  No nipple discharge.  Port in upper inner right breast.   LEFT:  Left breast tumor no longer palpable.  There may be some minimal thickening in the upper inner quadrant. No nipple discharge. UPPER EXTREMITIES:  No evidence of lymphedema.  Ultrasound in office:  Residual 1.0 cm tumor in UIQ of left breast.  The tumor seems close to the chest wall.  I thought I saw the clip. Patient saw pictures.  DATA REVIEWED: MRI images reviewed.  I repeated an [place] today.  The tumor is as small as before, if not smaller.

## 2011-09-15 ENCOUNTER — Other Ambulatory Visit (INDEPENDENT_AMBULATORY_CARE_PROVIDER_SITE_OTHER): Payer: Self-pay | Admitting: Surgery

## 2011-09-15 DIAGNOSIS — C50912 Malignant neoplasm of unspecified site of left female breast: Secondary | ICD-10-CM

## 2011-09-22 ENCOUNTER — Encounter (HOSPITAL_BASED_OUTPATIENT_CLINIC_OR_DEPARTMENT_OTHER): Payer: Self-pay | Admitting: *Deleted

## 2011-09-24 ENCOUNTER — Ambulatory Visit: Payer: BC Managed Care – PPO

## 2011-09-24 ENCOUNTER — Ambulatory Visit: Payer: BC Managed Care – PPO | Admitting: Radiation Oncology

## 2011-09-25 ENCOUNTER — Encounter (HOSPITAL_BASED_OUTPATIENT_CLINIC_OR_DEPARTMENT_OTHER)
Admission: RE | Admit: 2011-09-25 | Discharge: 2011-09-25 | Disposition: A | Discharge status: Home / Self Care | Payer: BC Managed Care – PPO | Source: Ambulatory Visit | Attending: Surgery | Admitting: Surgery

## 2011-09-25 LAB — COMPREHENSIVE METABOLIC PANEL
ALT: 26 U/L (ref 0–35)
AST: 22 U/L (ref 0–37)
Albumin: 3.6 g/dL (ref 3.5–5.2)
Calcium: 9.3 mg/dL (ref 8.4–10.5)
Creatinine, Ser: 0.65 mg/dL (ref 0.50–1.10)
GFR calc non Af Amer: >90 mL/min (ref >90)
Sodium: 141 mEq/L (ref 135–145)
Total Protein: 7.1 g/dL (ref 6.0–8.3)

## 2011-09-25 LAB — DIFFERENTIAL
Basophils Absolute: 0 10*3/uL (ref 0.0–0.1)
Basophils Relative: 0 % (ref 0–1)
Eosinophils Absolute: 0.1 10*3/uL (ref 0.0–0.7)
Eosinophils Relative: 1 % (ref 0–5)
Lymphocytes Relative: 13 % (ref 12–46)
Monocytes Absolute: 0.4 10*3/uL (ref 0.1–1.0)

## 2011-09-25 LAB — CBC
HCT: 39.1 % (ref 36.0–46.0)
Hemoglobin: 12.8 g/dL (ref 12.0–15.0)
MCH: 30.6 pg (ref 26.0–34.0)
MCHC: 32.7 g/dL (ref 30.0–36.0)
MCV: 93.5 fL (ref 78.0–100.0)
Platelets: 303 K/uL (ref 150–400)
RBC: 4.18 MIL/uL (ref 3.87–5.11)
RDW: 12.8 % (ref 11.5–15.5)
WBC: 7.1 K/uL (ref 4.0–10.5)

## 2011-09-28 NOTE — Pre-Procedure Instructions (Signed)
Received call from Sarah at [facility] - Dr. Ezzard Standing has reviewed pt's labs. and pt. is OK for surgery.

## 2011-09-28 NOTE — Progress Notes (Signed)
Quick Note:  These labs are OK for surgery. ______ 

## 2011-09-28 NOTE — Progress Notes (Signed)
I notified Olegario Messier at [facility] that the labs are ok for surgery.

## 2011-09-29 ENCOUNTER — Ambulatory Visit (HOSPITAL_BASED_OUTPATIENT_CLINIC_OR_DEPARTMENT_OTHER): Payer: BC Managed Care – PPO | Admitting: *Deleted

## 2011-09-29 ENCOUNTER — Other Ambulatory Visit (INDEPENDENT_AMBULATORY_CARE_PROVIDER_SITE_OTHER): Payer: Self-pay | Admitting: Surgery

## 2011-09-29 ENCOUNTER — Ambulatory Visit (HOSPITAL_COMMUNITY)
Admission: RE | Admit: 2011-09-29 | Discharge: 2011-09-29 | Disposition: A | Discharge status: Home / Self Care | Payer: BC Managed Care – PPO | Source: Ambulatory Visit | Attending: Surgery | Admitting: Surgery

## 2011-09-29 ENCOUNTER — Encounter (HOSPITAL_BASED_OUTPATIENT_CLINIC_OR_DEPARTMENT_OTHER): Payer: Self-pay | Admitting: *Deleted

## 2011-09-29 ENCOUNTER — Ambulatory Visit
Admission: RE | Admit: 2011-09-29 | Discharge: 2011-09-29 | Disposition: A | Discharge status: Home / Self Care | Payer: BC Managed Care – PPO | Source: Ambulatory Visit | Attending: Surgery | Admitting: Surgery

## 2011-09-29 ENCOUNTER — Ambulatory Visit (HOSPITAL_BASED_OUTPATIENT_CLINIC_OR_DEPARTMENT_OTHER)
Admission: RE | Admit: 2011-09-29 | Discharge: 2011-09-29 | Disposition: A | Discharge status: Home / Self Care | Payer: BC Managed Care – PPO | Source: Ambulatory Visit | Attending: Surgery | Admitting: Surgery

## 2011-09-29 ENCOUNTER — Encounter (HOSPITAL_BASED_OUTPATIENT_CLINIC_OR_DEPARTMENT_OTHER)
Admission: RE | Disposition: A | Discharge status: Home / Self Care | Payer: Self-pay | Source: Ambulatory Visit | Attending: Surgery

## 2011-09-29 DIAGNOSIS — Z01812 Encounter for preprocedural laboratory examination: Secondary | ICD-10-CM | POA: Insufficient documentation

## 2011-09-29 DIAGNOSIS — C50919 Malignant neoplasm of unspecified site of unspecified female breast: Secondary | ICD-10-CM

## 2011-09-29 DIAGNOSIS — C50219 Malignant neoplasm of upper-inner quadrant of unspecified female breast: Secondary | ICD-10-CM | POA: Insufficient documentation

## 2011-09-29 DIAGNOSIS — C50912 Malignant neoplasm of unspecified site of left female breast: Secondary | ICD-10-CM

## 2011-09-29 DIAGNOSIS — T451X5A Adverse effect of antineoplastic and immunosuppressive drugs, initial encounter: Secondary | ICD-10-CM | POA: Insufficient documentation

## 2011-09-29 DIAGNOSIS — G622 Polyneuropathy due to other toxic agents: Secondary | ICD-10-CM | POA: Insufficient documentation

## 2011-09-29 HISTORY — DX: Other complications of anesthesia, initial encounter: T88.59XA

## 2011-09-29 HISTORY — PX: BREAST LUMPECTOMY: SHX2

## 2011-09-29 HISTORY — DX: Other seasonal allergic rhinitis: J30.2

## 2011-09-29 HISTORY — DX: Anxiety disorder, unspecified: F41.9

## 2011-09-29 HISTORY — DX: Adverse effect of unspecified anesthetic, initial encounter: T41.45XA

## 2011-09-29 HISTORY — PX: BREAST LUMPECTOMY W/ NEEDLE LOCALIZATION: SHX1266

## 2011-09-29 SURGERY — BREAST LUMPECTOMY WITH EXCISION OF SENTINEL NODE
Anesthesia: General | Site: Breast | Wound class: Clean

## 2011-09-29 MED ORDER — PROMETHAZINE HCL 25 MG/ML IJ SOLN: 6.2500 mg | INTRAMUSCULAR | Status: DC | PRN

## 2011-09-29 MED ORDER — PROPOFOL 10 MG/ML IV EMUL
INTRAVENOUS | Status: DC | PRN
  Administered 2011-09-29: 160 mg via INTRAVENOUS

## 2011-09-29 MED ORDER — TECHNETIUM TC 99M SULFUR COLLOID FILTERED
1.0000 | Freq: Once | INTRAVENOUS | Status: AC | PRN
Start: 2011-09-29 — End: 2011-09-29
  Administered 2011-09-29: 1 via INTRADERMAL

## 2011-09-29 MED ORDER — SODIUM CHLORIDE 0.9 % IJ SOLN
INTRAMUSCULAR | Status: DC | PRN
  Administered 2011-09-29: 11:00:00 via INTRAMUSCULAR

## 2011-09-29 MED ORDER — MIDAZOLAM HCL 5 MG/5ML IJ SOLN
INTRAMUSCULAR | Status: DC | PRN
  Administered 2011-09-29: 1 mg via INTRAVENOUS

## 2011-09-29 MED ORDER — FENTANYL CITRATE 0.05 MG/ML IJ SOLN
INTRAMUSCULAR | Status: DC | PRN
  Administered 2011-09-29 (×3): 50 ug via INTRAVENOUS

## 2011-09-29 MED ORDER — HYDROCODONE-ACETAMINOPHEN 5-325 MG PO TABS: 1.0000 | ORAL_TABLET | Freq: Four times a day (QID) | ORAL | Status: DC | PRN

## 2011-09-29 MED ORDER — LACTATED RINGERS IV SOLN
INTRAVENOUS | Status: DC
  Administered 2011-09-29 (×2): via INTRAVENOUS

## 2011-09-29 MED ORDER — BUPIVACAINE HCL (PF) 0.25 % IJ SOLN
INTRAMUSCULAR | Status: DC | PRN
  Administered 2011-09-29: 30 mL

## 2011-09-29 MED ORDER — DEXAMETHASONE SODIUM PHOSPHATE 4 MG/ML IJ SOLN
INTRAMUSCULAR | Status: DC | PRN
  Administered 2011-09-29: 10 mg via INTRAVENOUS

## 2011-09-29 MED ORDER — CEFAZOLIN SODIUM 1-5 GM-% IV SOLN
1.0000 g | INTRAVENOUS | Status: AC
  Administered 2011-09-29: 2 g via INTRAVENOUS

## 2011-09-29 MED ORDER — KETOROLAC TROMETHAMINE 30 MG/ML IJ SOLN: 15.0000 mg | Freq: Once | INTRAMUSCULAR | Status: DC | PRN

## 2011-09-29 MED ORDER — MEPERIDINE HCL 25 MG/ML IJ SOLN: 6.2500 mg | INTRAMUSCULAR | Status: DC | PRN

## 2011-09-29 MED ORDER — FENTANYL CITRATE 0.05 MG/ML IJ SOLN
50.0000 ug | Freq: Once | INTRAMUSCULAR | Status: AC
  Administered 2011-09-29: 50 ug via INTRAVENOUS

## 2011-09-29 MED ORDER — LIDOCAINE HCL (CARDIAC) 20 MG/ML IV SOLN
INTRAVENOUS | Status: DC | PRN
  Administered 2011-09-29: 60 mg via INTRAVENOUS

## 2011-09-29 MED ORDER — HYDROMORPHONE HCL PF 1 MG/ML IJ SOLN
0.2500 mg | INTRAMUSCULAR | Status: DC | PRN
  Administered 2011-09-29 (×3): 0.5 mg via INTRAVENOUS

## 2011-09-29 MED ORDER — ONDANSETRON HCL 4 MG/2ML IJ SOLN
INTRAMUSCULAR | Status: DC | PRN
  Administered 2011-09-29: 4 mg via INTRAVENOUS

## 2011-09-29 MED ORDER — MIDAZOLAM HCL 2 MG/2ML IJ SOLN
1.0000 mg | Freq: Once | INTRAMUSCULAR | Status: AC
  Administered 2011-09-29: 1 mg via INTRAVENOUS

## 2011-09-29 SURGICAL SUPPLY — 68 items
APPLIER CLIP 11 MED OPEN (CLIP)
BANDAGE ELASTIC 6 VELCRO ST LF (GAUZE/BANDAGES/DRESSINGS) IMPLANT
BENZOIN TINCTURE PRP APPL 2/3 (GAUZE/BANDAGES/DRESSINGS) IMPLANT
BINDER BREAST LRG (GAUZE/BANDAGES/DRESSINGS) IMPLANT
BINDER BREAST MEDIUM (GAUZE/BANDAGES/DRESSINGS) IMPLANT
BINDER BREAST XLRG (GAUZE/BANDAGES/DRESSINGS) ×3 IMPLANT
BINDER BREAST XXLRG (GAUZE/BANDAGES/DRESSINGS) IMPLANT
BLADE HEX COATED 2.75 (ELECTRODE) IMPLANT
BLADE SURG 10 STRL SS (BLADE) ×3 IMPLANT
BLADE SURG 15 STRL LF DISP TIS (BLADE) ×2 IMPLANT
BLADE SURG 15 STRL SS (BLADE) ×1
CANISTER SUCTION 1200CC (MISCELLANEOUS) ×3 IMPLANT
CHLORAPREP W/TINT 26ML (MISCELLANEOUS) ×3 IMPLANT
CLIP APPLIE 11 MED OPEN (CLIP) IMPLANT
CLIP TI WIDE RED SMALL 6 (CLIP) ×3 IMPLANT
CLOTH BEACON ORANGE TIMEOUT ST (SAFETY) ×3 IMPLANT
COVER MAYO STAND STRL (DRAPES) ×3 IMPLANT
COVER PROBE W GEL 5X96 (DRAPES) ×3 IMPLANT
COVER TABLE BACK 60X90 (DRAPES) ×3 IMPLANT
DECANTER SPIKE VIAL GLASS SM (MISCELLANEOUS) IMPLANT
DERMABOND ADVANCED (GAUZE/BANDAGES/DRESSINGS) ×1
DERMABOND ADVANCED .7 DNX12 (GAUZE/BANDAGES/DRESSINGS) ×2 IMPLANT
DEVICE DUBIN W/COMP PLATE 8390 (MISCELLANEOUS) ×3 IMPLANT
DRAIN CHANNEL 19F RND (DRAIN) IMPLANT
DRAIN HEMOVAC 1/8 X 5 (WOUND CARE) IMPLANT
DRAPE LAPAROSCOPIC ABDOMINAL (DRAPES) ×3 IMPLANT
DRAPE PED LAPAROTOMY (DRAPES) IMPLANT
DRAPE UTILITY XL STRL (DRAPES) ×3 IMPLANT
ELECT COATED BLADE 2.86 ST (ELECTRODE) ×3 IMPLANT
ELECT REM PT RETURN 9FT ADLT (ELECTROSURGICAL) ×3
ELECTRODE REM PT RTRN 9FT ADLT (ELECTROSURGICAL) ×2 IMPLANT
EVACUATOR SILICONE 100CC (DRAIN) IMPLANT
GAUZE SPONGE 4X4 12PLY STRL LF (GAUZE/BANDAGES/DRESSINGS) IMPLANT
GAUZE SPONGE 4X4 16PLY XRAY LF (GAUZE/BANDAGES/DRESSINGS) IMPLANT
GLOVE BIO SURGEON STRL SZ 6.5 (GLOVE) ×3 IMPLANT
GLOVE INDICATOR 7.0 STRL GRN (GLOVE) ×3 IMPLANT
GLOVE SKINSENSE NS SZ6.5 (GLOVE) ×1
GLOVE SKINSENSE STRL SZ6.5 (GLOVE) ×2 IMPLANT
GLOVE SURG SIGNA 7.5 PF LTX (GLOVE) ×6 IMPLANT
GOWN PREVENTION PLUS XLARGE (GOWN DISPOSABLE) ×6 IMPLANT
GOWN PREVENTION PLUS XXLARGE (GOWN DISPOSABLE) ×3 IMPLANT
KIT MARKER MARGIN INK (KITS) ×3 IMPLANT
NDL SAFETY ECLIPSE 18X1.5 (NEEDLE) ×2 IMPLANT
NEEDLE HYPO 18GX1.5 SHARP (NEEDLE) ×1
NEEDLE HYPO 25X1 1.5 SAFETY (NEEDLE) ×6 IMPLANT
NS IRRIG 1000ML POUR BTL (IV SOLUTION) ×3 IMPLANT
PACK BASIN DAY SURGERY FS (CUSTOM PROCEDURE TRAY) ×3 IMPLANT
PAD ALCOHOL SWAB (MISCELLANEOUS) ×3 IMPLANT
PENCIL BUTTON HOLSTER BLD 10FT (ELECTRODE) ×3 IMPLANT
PIN SAFETY STERILE (MISCELLANEOUS) IMPLANT
SHEET MEDIUM DRAPE 40X70 STRL (DRAPES) ×3 IMPLANT
SLEEVE SCD COMPRESS KNEE MED (MISCELLANEOUS) ×3 IMPLANT
SPONGE GAUZE 4X4 12PLY (GAUZE/BANDAGES/DRESSINGS) IMPLANT
SPONGE LAP 18X18 X RAY DECT (DISPOSABLE) ×3 IMPLANT
STAPLER VISISTAT 35W (STAPLE) IMPLANT
STRIP CLOSURE SKIN 1/4X4 (GAUZE/BANDAGES/DRESSINGS) IMPLANT
SUT ETHILON 2 0 FS 18 (SUTURE) IMPLANT
SUT MON AB 5-0 PS2 18 (SUTURE) ×3 IMPLANT
SUT SILK 3 0 TIES 17X18 (SUTURE)
SUT SILK 3-0 18XBRD TIE BLK (SUTURE) IMPLANT
SUT VIC AB 5-0 PS2 18 (SUTURE) ×3 IMPLANT
SUT VICRYL 3-0 CR8 SH (SUTURE) ×3 IMPLANT
SYR CONTROL 10ML LL (SYRINGE) ×6 IMPLANT
TOWEL OR 17X24 6PK STRL BLUE (TOWEL DISPOSABLE) ×3 IMPLANT
TOWEL OR NON WOVEN STRL DISP B (DISPOSABLE) ×3 IMPLANT
TUBE CONNECTING 20X1/4 (TUBING) ×3 IMPLANT
WATER STERILE IRR 1000ML POUR (IV SOLUTION) IMPLANT
YANKAUER SUCT BULB TIP NO VENT (SUCTIONS) ×3 IMPLANT

## 2011-09-29 NOTE — Anesthesia Postprocedure Evaluation (Signed)
  Anesthesia Post-op Note  Patient: Crystal Lane  Procedure(s) Performed:  BREAST LUMPECTOMY WITH EXCISION OF SENTINEL NODE - Needle localization @ [facility] 7:30, SLNbx(nuc med  9:30)  Patient Location: PACU  Anesthesia Type: General  Level of Consciousness: awake  Airway and Oxygen Therapy: Patient Spontanous Breathing  Post-op Pain: none  Post-op Assessment: Post-op Vital signs reviewed  Post-op Vital Signs: stable  Complications: No apparent anesthesia complications

## 2011-09-29 NOTE — Anesthesia Procedure Notes (Addendum)
Procedure Name: LMA Insertion Date/Time: 09/29/2011 10:39 AM Performed by: Meyer Russel Pre-anesthesia Checklist: Patient identified, Emergency Drugs available, Suction available, Patient being monitored and Timeout performed Patient Re-evaluated:Patient Re-evaluated prior to inductionOxygen Delivery Method: Circle System Utilized Preoxygenation: Pre-oxygenation with 100% oxygen Intubation Type: IV induction Ventilation: Mask ventilation without difficulty LMA: LMA inserted LMA Size: 3.0 Number of attempts: 1 Airway Equipment and Method: bite block Placement Confirmation: positive ETCO2 and breath sounds checked- equal and bilateral Tube secured with: Tape Dental Injury: Teeth and Oropharynx as per pre-operative assessment     Performed by: Suann Larry WOLFE    Performed by: Meyer Russel

## 2011-09-29 NOTE — Brief Op Note (Signed)
09/29/2011  12:34 PM  PATIENT:  Crystal Lane  35 y.o. female  PRE-OPERATIVE DIAGNOSIS:  Left breast cancer  POST-OPERATIVE DIAGNOSIS:  Left breast cancer  PROCEDURE:  Procedure(s): BREAST LUMPECTOMY WITH EXCISION OF SENTINEL NODE  SURGEON:  Surgeon(s): Kandis Cocking, MD  ASSISTANTS: None  ANESTHESIA:   general  EBL:  Total I/O In: 1300 [I.V.:1300] Out: -   BLOOD ADMINISTERED:none  DRAINS: none   LOCAL MEDICATIONS USED:  BUPIVICAINE 30 CC  SPECIMEN:  Left Lumpectomy/left axillary sentinel lymph node  Dictation #: [id number]  09/29/2011  DN

## 2011-09-29 NOTE — Transfer of Care (Signed)
Immediate Anesthesia Transfer of Care Note  Patient: Crystal Lane  Procedure(s) Performed:  BREAST LUMPECTOMY WITH EXCISION OF SENTINEL NODE - Needle localization @ [facility] 7:30, SLNbx(nuc med  9:30)  Patient Location: PACU  Anesthesia Type: General  Level of Consciousness: awake, sedated and patient cooperative  Airway & Oxygen Therapy: Patient Spontanous Breathing and Patient connected to face mask oxygen  Post-op Assessment: Report given to PACU RN, Post -op Vital signs reviewed and stable and Patient moving all extremities  Post vital signs: Reviewed and stable  Complications: No apparent anesthesia complications

## 2011-09-29 NOTE — H&P (View-Only) (Signed)
ASSESSMENT AND PLAN: 1.  Left breast cancer, upper inner quadrant.    T2, N0.   BRCA 1 positive.  Triple Negative.  Note, on US, the tumor looks close to the chest wall.  The clip may be a little more superficial.  Finished neoadjuvant therapy by Dr. Magrinat.  Developed neuropathy with Taxol.  I talked to Dr. Magrinat today.   She still has numbness from Taxol.  Tumor size decreased by 1/3.  Clip placed in left breast - 07/27/2011.   I have discussed the options for breast cancer treatment with the patient.  The patient is ready for lumpectomy, SLNBx, and power port removal. I discussed needle localization, injection for SLNBx.  At first she did not want any clips left in her breast, but she said it is okay if I leave marking clips for radiation oncology.  She had some trouble with the skin prep and nausea last time. The risks of surgery include, but are not limited to, bleeding, infection, the need for further surgery, and nerve injury.  She is accompanied with her mother.  The patient has been given literature on the treatment of breast cancer.  2.  Power port placed 05/14/2011.  Patient will do what we want.  [I talked to Dr Magrinat and we think it is best to leave the port until the final path is in.  DN 11/16/2011] 3.  BRCA 1 positive.  Wants to try breast conservation. 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  5.  Has started fund raising with another patient of mine, Crystal Lane (#[phone]).  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  HISTORY OF PRESENT ILLNESS:  Crystal Lane is a 35 y.o. (DOB: 08/27/1976)  white female who is a patient of JUDGE,ERIN, FNP, FNP and comes to me today for followup of neoadjuvant chemotherapy for a left breast cancer.  Ms. 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 She originally saw Dr. Judith Hopkins for oncology, Dr. Scott Berger of [facility] for  surgery, and Dr. Linda Evans for radiation oncology.  But she decided to have her care in [place] and has been seen by our medical community.  She has seen Dr. G. Magrinat for medical oncology and Dr. R. Murray for radiation oncology.  She tried to harvest some ovaries with the assistance of Dr. Johnston-MacAnanny in June 2012, but this did not work.  The tumor originally measured about 4.3 cm on MRI dated 04/01/2011. She is also BRCA1 positive. The patient wants to try breast conservation therapy.  She originally underwent a biopsy 03/27/2011 which showed Invasive ductal carcinoma read by Dr. Wilson Russell.  The tumor is triple negative.  She is accompanied with her mom today.  She has finished chemotx (Taxol).  She stopped prematurely because of worsening neuropathy.  She is ready for surgery.  She can't feel the tumor anymore.  She discussed with me some of her efforts at fund raising for breast cancer.  PHYSICAL EXAM: BP 110/74  Pulse 72  Temp(Src) 97.6 F (36.4 C) (Temporal)  Resp 16  Ht 5' 2" (1.575 m)  Wt 179 lb 6.4 oz (81.375 kg)  BMI 32.81 kg/m2  HEENT:  Pupils equal.  Dentition good.  No injury. NECK:  Supple.  No thyroid mass. LYMPH NODES:  No cervical, supraclavicular, or axillary adenopathy. BREASTS -  RIGHT:  No palpable mass or nodule.  No nipple discharge.    Port in upper inner right breast.   LEFT:  Left breast tumor no longer palpable.  There may be some minimal thickening in the upper inner quadrant. No nipple discharge. UPPER EXTREMITIES:  No evidence of lymphedema.  Ultrasound in office:  Residual 1.0 cm tumor in UIQ of left breast.  The tumor seems close to the chest wall.  I thought I saw the clip. Patient saw pictures.  DATA REVIEWED: MRI images reviewed.  I repeated an US today.  The tumor is as small as before, if not smaller.  

## 2011-09-29 NOTE — Interval H&P Note (Signed)
History and Physical Interval Note:   09/29/2011   10:08 AM   Crystal Lane  has presented today for surgery, with the diagnosis of Left breast cancer, portacath not needed  The various methods of treatment have been discussed with the patient and family. After consideration of risks, benefits and other options for treatment, the patient has consented to  Procedure(s): BREAST LUMPECTOMY WITH EXCISION OF SENTINEL NODE REMOVAL PORT-A-CATH as a surgical intervention .  The patients' history has been reviewed, patient examined, no change in status, stable for surgery.  I have reviewed the patients' chart and labs.  Questions were answered to the patient's satisfaction.     Kandis Cocking  MD

## 2011-09-29 NOTE — Anesthesia Preprocedure Evaluation (Addendum)
Anesthesia Evaluation  Patient identified by MRN, date of birth, ID band Patient awake    Reviewed: Allergy & Precautions, H&P , NPO status   History of Anesthesia Complications (+) Family history of anesthesia reaction  Airway Mallampati: II  Neck ROM: full    Dental  (+) Teeth Intact   Pulmonary neg pulmonary ROS,  clear to auscultation        Cardiovascular neg cardio ROS regular Normal    Neuro/Psych    GI/Hepatic negative GI ROS, Neg liver ROS,   Endo/Other  Negative Endocrine ROS  Renal/GU negative Renal ROS     Musculoskeletal   Abdominal   Peds  Hematology   Anesthesia Other Findings   Reproductive/Obstetrics                          Anesthesia Physical Anesthesia Plan  ASA: II  Anesthesia Plan: General   Post-op Pain Management:    Induction:   Airway Management Planned:   Additional Equipment:   Intra-op Plan:   Post-operative Plan:   Informed Consent: I have reviewed the patients History and Physical, chart, labs and discussed the procedure including the risks, benefits and alternatives for the proposed anesthesia with the patient or authorized representative who has indicated his/her understanding and acceptance.     Plan Discussed with: CRNA and Surgeon  Anesthesia Plan Comments:        Anesthesia Quick Evaluation

## 2011-09-30 ENCOUNTER — Encounter (INDEPENDENT_AMBULATORY_CARE_PROVIDER_SITE_OTHER): Payer: BC Managed Care – PPO | Admitting: Surgery

## 2011-09-30 NOTE — Op Note (Signed)
NAME:  Crystal Lane, Crystal Lane                   ACCOUNT NO.:  MEDICAL RECORD NO.:  [id number]  LOCATION:                                 FACILITY:  PHYSICIAN:  Sandria Bales. Ezzard Standing, M.D.  DATE OF BIRTH:  1976-10-27  DATE OF PROCEDURE:  09/29/2011                               OPERATIVE REPORT   PREOPERATIVE DIAGNOSIS:  Left breast cancer, UIQ, post neoadjuvant chemotherapy.  POSTOPERATIVE DIAGNOSIS:  Left breast cancer upper inner quadrant, post neoadjuvant chemotherapy, stage (T2, N0).  PROCEDURE:  Left breast needle loc lumpectomy, left axillary sentinel lymph node biopsy.  SURGEONS:  Sandria Bales. Ezzard Standing, M.D.  ASSISTANT:  No first assistant.  ANESTHESIA:  General LMA with approximately 30 mL of plain Marcaine.  COMPLICATIONS:  None.  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  She is also BRCA1 positive, but she wants to try breast conservation.  She has been through neoadjuvant chemotherapy supervised by Dr. Ruthann Cancer, has had  response of her tumor where it is now nonpalpable, on my last ultrasound, measured maybe 1 cm in size.  We talked about proceeding with left breast lumpectomy and left axillary sentinel lymph node biopsy.  We also had a lengthy discussion about her Port-A-Cath, but since the final pathology is pending, there could still be a possibility of further discussion of adjuvant therapy.  We decided to leave the Port-A-Cath in place.  The risks of surgery include bleeding, infection, nerve injury, need for further surgery, recurrence of the tumor.  OPERATIVE NOTE:  The patient presented to the [facility].  After a wire was placed in her left breast by Dr. Anselmo Pickler at the [facility], she had an injection with 1 millicuries of technetium sulfur colloid for identification of the sentinel lymph node.  She was then taken to  the operating room #3, a time-out was held and surgical checklist run.  I injected her subareolar space about 1 mL of 40% methylene blue.  She then had her left breast prepped with Betadine with ChloraPrep sterilely draped.  I first went to the left axilla, I made an incision in the left axilla.  Finding of lymph node which was blue and hot with counts of 5000, background was between 80 to 100.  This was then sent for permanent pathology.  The lymph nodes were grossly unremarkable and there was no gross disease in the axilla.  Then, she had a wire coming up the upper inner aspect of her left breast with wire directed laterally.  I made an approximately 5 cm incision over the area where the clip was.  I excised a block of breast tissue approximately 8 x 7 cm.  I went down to the chest wall, and painted the specimen with the 6 colors paint kit.  The is yellow on the medial being opposite of the sternum.  I then shot a specimen mammogram, confirmed the clip and the wire in the middle of the specimen.  Because of what I thought was the closest margin may be the inferior  medial side, I excised and an additional 4 to 5 cm of tissue about 1 cm thick along the inferior medial cavity.  I painted this with a new medial margin.  The lateral margin, matching up the old medial margin, and again this abutted the sternum and I sent it as a separate specimen.  I then irrigated both wounds.  I placed 30 mL of 0.25% Marcaine as a block in both wounds.  I used 6 clips, one on each wall of the biopsy cavity and 2 at the base of the cavity.  I then closed the subcutaneous tissues with 3-0 Vicryl suture.  I closed the skin with 5-0 Monocryl suture.  Both wounds were then painted with Dermabond.  The wound breast was then wrapped.  The patient tolerated the procedure well, and will be discharged home today.  Please note the patient is being discharged through [facility] so there could be some errors, and her  discharge instructions, but she was given hydrocodone or Vicodin 30 tablets with 1 refill.  She will see me back in 7-10 days for followup and evaluation pathology.   Sandria Bales. Ezzard Standing, M.D., FACS   DHN/MEDQ  D:  09/29/2011  T:  09/29/2011  Job:  [id number]  cc:   Loleta Dicker, MD Lowella Dell, M.D. Anette Guarneri

## 2011-10-05 ENCOUNTER — Encounter (INDEPENDENT_AMBULATORY_CARE_PROVIDER_SITE_OTHER): Payer: Self-pay

## 2011-10-06 ENCOUNTER — Encounter (HOSPITAL_BASED_OUTPATIENT_CLINIC_OR_DEPARTMENT_OTHER): Payer: Self-pay | Admitting: Surgery

## 2011-10-07 ENCOUNTER — Ambulatory Visit (INDEPENDENT_AMBULATORY_CARE_PROVIDER_SITE_OTHER): Payer: BC Managed Care – PPO | Admitting: Surgery

## 2011-10-07 ENCOUNTER — Encounter (INDEPENDENT_AMBULATORY_CARE_PROVIDER_SITE_OTHER): Payer: Self-pay | Admitting: Surgery

## 2011-10-07 VITALS — BP 130/88 | HR 76 | Temp 97.8°F | Resp 16 | Ht 62.0 in | Wt 182.0 lb

## 2011-10-07 DIAGNOSIS — C50919 Malignant neoplasm of unspecified site of unspecified female breast: Secondary | ICD-10-CM

## 2011-10-07 NOTE — Progress Notes (Signed)
[facility]  Eoin Willden, MD,  FACS [address]  [address] [place], [place]    [address] Phone:  [phone] FAX:  [phone]   Re:   Crystal Lane DOB:   10/29/1976 MRN:   [id number]  ASSESSMENT AND PLAN: 1. Left breast cancer, upper inner quadrant.   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   I reviewed the path with the patient.  She is here for her post op visit.  Finished neoadjuvant therapy by Dr. Magrinat. Developed neuropathy with Taxol. No further tx planned therapy.  Dr. Murray to see for rad tx.   I will see her in 6 months for follow up of her breast cancer.  2. Power port placed 05/14/2011.   To remove at a time convenient with her. 3. BRCA 1 positive. Wants to try breast conservation.  4. 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  5. Has started fund raising with another patient of mine, Christie Lee (#[phone]). 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   HISTORY OF PRESENT ILLNESS: Chief Complaint  Patient presents with  . Routine Post Op    PO lt br and PAC removal    Crystal Lane is a 35 y.o. (DOB: 01/27/1976)  white female who is a patient of JUDGE,ERIN, FNP, FNP and comes to me today for follow up of left breast lumpectomy.  For follow up of left breast lumpectomy.  She is doing well except for some numbness left upper arm.  She also had an entertaining time at our office.  The fire alarm went off and a patient in our waiting area was a little beligerant.  Path (side, TNM): left.  T2, N0. Surgery: Left breast lumpectomy, Left ax SLNBx   Date: 09/29/2011 Size of tumor: 2.2 cm  (post neoadjuvant tx) Nodes: 0/1 ER: 0% PR: 0% Ki67: 82%  HER2Neu: neg  Medical Oncologist: Magrinat  Radiation Oncologist: Murray    PHYSICAL EXAM: BP 130/88  Pulse 76  Temp(Src) 97.8 F (36.6 C) (Temporal)  Resp 16  Ht 5' 2" (1.575 m)  Wt 182 lb (82.555 kg)  BMI 33.29 kg/m2  LMP 08/09/2011  HEENT:  Pupils equal.  Dentition good.  No  injury. NECK:  Supple.  No thyroid mass. LYMPH NODES:  No cervical, supraclavicular, or axillary adenopathy. BREASTS -  RIGHT:  No palpable mass or nodule.  No nipple discharge.   LEFT:  Incision in UIQ looks good.  She has some numbness under left axilla to left upper arm.  Has good ROM of left arm. UPPER EXTREMITIES:  No evidence of lymphedema.   DATA REVIEWED: Path to patient.  Georgio Hattabaugh, MD, FACS Office:  [phone]  

## 2011-10-07 NOTE — Progress Notes (Signed)
CC:   Sandria Bales. Ezzard Standing, M.D. Maryln Gottron, M.D. Loleta Dicker, MD Mathis Bud, MD Jeni Salles, MD  Sherrill returns today with her mother Andrey Campanile and her brother Kenard Gower for followup of Carliyah's breast cancer.  We spent the better part of her 60- minute appointment today discussing issues regarding what to do if the cancer completely disappears (she has refused to have a clip placed so far), side effects from the prior chemo, and upcoming chemotherapy issues.  She completed her final, fourth cycle of doxorubicin and cyclophosphamide on August 28.  She says this last cycle really took it out of her.  She felt very weak.  She was taking a shower at home, fell, hit her nose and left forearm and right knee.  She has not been able to drive because she has been so tired.  Partly she tells me she did try to take an antidepressant prescribed by [place] and after just 1 day that only made her even sleepier and dizzier so she stopped it.  She never had vomiting.  She never took Reglan or Compazine which she cannot tolerate. She took only the dexamethasone but that worked well for her and it did not cause her to have insomnia, thrush or other common side effects.  In fact a separately scanned detailed review of systems today was negative except for the fatigue and fall issues.  She is however very upset because she decided initially she did not want to have a marker in her breast, she did not want any foreign body in her body, but Dr. Ezzard Standing is very concerned that if we do not place a marker and she has a complete response which is what we are hoping she will have, there will literally be no idea where to go and do the lumpectomy. She understands this completely.  She just is very reluctant to have a marker placed.  Her medication list was also separately updated today.  On physical exam temperature is 98.6, pulse 83, respiratory rate 20, blood pressure 104/73 and her weight is 167 pounds.  It is  unchanged from baseline.  Sclerae are not icteric.  Oropharynx clear.  Neck: Supple.  Lungs:  No crackles or wheezes.  Heart:  Regular rate and rhythm.  Right Breast:  No suspicious finding.  Left Breast:  Careful exam of the left breast and axilla do not show a measurable palpable lump.  There are no skin changes.  No nipple retraction.  Abdomen: Benign.  Musculoskeletal:  No focal spinal tenderness.  No peripheral edema.  Neurologic:  Nonfocal.  Labs today show a white cell count of 5.0, hemoglobin 12 and platelets 214,000.  IMPRESSION AND PLAN:  A 35 year old [place] woman, BRCA-1 positive, with a complex cystic lesion in the upper inner quadrant of the left breast measuring 4.3 cm by MRI, biopsy showing a high-grade invasive ductal carcinoma, triple negative, with an elevated MIB-1, being treated neoadjuvantly, status post 4 cycles of dose dense doxorubicin/cyclophosphamide, now ready to start weekly paclitaxel x12.  She would like to wait until September 20 to get her Taxol started so she can more fully recover.  She is quite anxious regarding possible side effects.  We did review these at length and particularly the question regarding peripheral neuropathy developing which she understands may be a permanent problem.  She certainly may drive while taking her Taxol and she would like to change the treatment day to Thursdays which we were glad to operationalize today.  Otherwise we  will follow her on a once a week basis until she completes her treatment.  At the end of that she will have another breast MRI and we will refer her back to Dr. Ezzard Standing for definitive surgery.  MRI done today is still pending but I think we will see at least a 50% reduction in the mass.  She will call for those results.  Incidentally her brother Kenard Gower is going to be working on a zombie movie he tells me.  He is going to be on the side of people doing the  re- killing.    ______________________________ Lowella Dell, M.D. GCM/MEDQ  D:  07/22/2011  T:  07/22/2011  Job:  [id number]

## 2011-10-16 ENCOUNTER — Encounter: Payer: Self-pay | Admitting: *Deleted

## 2011-10-19 ENCOUNTER — Encounter: Payer: Self-pay | Admitting: *Deleted

## 2011-10-20 ENCOUNTER — Other Ambulatory Visit: Payer: BC Managed Care – PPO | Admitting: Lab

## 2011-10-20 ENCOUNTER — Ambulatory Visit (HOSPITAL_BASED_OUTPATIENT_CLINIC_OR_DEPARTMENT_OTHER): Payer: PRIVATE HEALTH INSURANCE | Admitting: Oncology

## 2011-10-20 ENCOUNTER — Ambulatory Visit
Admission: RE | Admit: 2011-10-20 | Discharge: 2011-10-20 | Disposition: A | Discharge status: Home / Self Care | Payer: BC Managed Care – PPO | Source: Ambulatory Visit | Attending: Radiation Oncology | Admitting: Radiation Oncology

## 2011-10-20 ENCOUNTER — Encounter: Payer: Self-pay | Admitting: Radiation Oncology

## 2011-10-20 ENCOUNTER — Other Ambulatory Visit: Payer: Self-pay | Admitting: Oncology

## 2011-10-20 VITALS — BP 116/77 | HR 73 | Temp 97.0°F | Resp 18 | Wt 182.3 lb

## 2011-10-20 VITALS — BP 115/74 | HR 66 | Temp 98.3°F | Ht 61.5 in | Wt 181.1 lb

## 2011-10-20 DIAGNOSIS — C50919 Malignant neoplasm of unspecified site of unspecified female breast: Secondary | ICD-10-CM

## 2011-10-20 DIAGNOSIS — M7989 Other specified soft tissue disorders: Secondary | ICD-10-CM | POA: Insufficient documentation

## 2011-10-20 DIAGNOSIS — C50219 Malignant neoplasm of upper-inner quadrant of unspecified female breast: Secondary | ICD-10-CM | POA: Insufficient documentation

## 2011-10-20 DIAGNOSIS — R209 Unspecified disturbances of skin sensation: Secondary | ICD-10-CM | POA: Insufficient documentation

## 2011-10-20 DIAGNOSIS — Z510 Encounter for antineoplastic radiation therapy: Secondary | ICD-10-CM | POA: Insufficient documentation

## 2011-10-20 LAB — CBC WITH DIFFERENTIAL/PLATELET
Basophils Absolute: 0 10*3/uL (ref 0.0–0.1)
EOS%: 0.5 % (ref 0.0–7.0)
Eosinophils Absolute: 0 10*3/uL (ref 0.0–0.5)
HCT: 40 % (ref 34.8–46.6)
HGB: 13.5 g/dL (ref 11.6–15.9)
MONO#: 0.5 10*3/uL (ref 0.1–0.9)
NEUT#: 4.8 10*3/uL (ref 1.5–6.5)
RDW: 12 % (ref 11.2–14.5)
lymph#: 1.1 10*3/uL (ref 0.9–3.3)

## 2011-10-20 NOTE — Progress Notes (Signed)
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 I first saw the patient in consultation on 04/22/2011. We discussed her BRCA1 status and we recommended bilateral mastectomies. She strongly desires breast preservation, and therefore she was offered breast preservation with postoperative radiation therapy. On 09/29/2011 she underwent a left partial mastectomy and sentinel lymph node biopsy. She is found to have a 2.2 cm high grade, poorly differentiated invasive ductal carcinoma with the closest margin being 0.8 cm. There was no LV I. Her sentinel lymph node was free of metastatic disease. She is doing well postoperatively. She has him is completely recovered from her peripheral neuropathy which affected her left-hand the most.  Physical examination: Head and neck examination remarkable for regrowth of hair. Nodes: Without palpable cervical, supraclavicular, or axillary lymphadenopathy. Chest: Right anterior Port-A-Cath. Lungs: Clear. Heart regular in rhythm. Breasts there is a partial mastectomy wound along the upper inner quadrant of left breast extending from 10 to 12:00. The wound is healing well. The sentinel lymph node biopsy wound is also healing well. No dominant masses are appreciated. Right breast without masses or lesions. Extremities: Without edema.  Impression: Stage II (T2, N0, M0) invasive ductal carcinoma of left breast. We discussed the potential acute and late toxicities of radiation therapy. Based on her age and tumor differentiation she is not a good candidate for hypo-fractionated radiation therapy. We will offer her deep inspiration and breath-hold technology to avoid cardiac irradiation. She would like to wait until early January before  beginning her radiation therapy. Therefore, I will have her return for treatment planning the week of January 2. Consent was signed today. Next  30 minutes was spent face-to-face with the patient, primarily counseling the patient.

## 2011-10-20 NOTE — Progress Notes (Signed)
ID: Zinnia M Shepherd   Interval History: Deborrah returns today for followup of her breast cancer. Her mother Andrey Campanile is with her today. Since her last visit here Javaeh had her definitive left lumpectomy and sentinel lymph node sampling. The pathology is discussed further below. She has made a good postoperative recovery, is back to work 8-9 hours a day, has bought a house in [place] and is going to be meeting with [employer] discuss fundraising for the cancer Center.    ROS: She had very little postoperative pain, no fever, no bleeding, no dehiscence. She took 2 weeks off postoperatively from exercise and is currently not exercising specifically other than packing for the move and of course whenever exercise she does at work. The detailed review of systems is otherwise noncontributory. She had some discomfort in the medial aspect of her Left arm, but this is already improving.  Medications: I have reviewed the patient's current medications.  Current Outpatient Prescriptions  Medication Sig Dispense Refill  . ALPRAZolam (XANAX) 0.5 MG tablet Take 0.5 mg by mouth at bedtime as needed.        Marland Kitchen dexamethasone (DECADRON) 4 MG tablet Take 4 mg by mouth See admin instructions.        . lidocaine-prilocaine (EMLA) cream Apply topically as needed.        Marland Kitchen LORazepam (ATIVAN) 0.5 MG tablet Take 0.5 mg by mouth every 8 (eight) hours. Do not take in combination with xanax       . prochlorperazine (COMPAZINE) 10 MG tablet Take 10 mg by mouth every 6 (six) hours as needed.           Objective:  Filed Vitals:   10/20/11 1322  BP: 115/74  Pulse: 66  Temp: 98.3 F (36.8 C)     Physical Exam:    Sclerae unicteric  Oropharynx clear  No peripheral adenopathy  Lungs clear -- no rales or rhonchi  Heart regular rate and rhythm  Abdomen benign  MSK no focal spinal tenderness, no peripheral edema  Neuro nonfocal  Breast exam: Right breast no suspicious findings; left breast status post recent  lumpectomy; there is no evidence of dehiscence inflammation and swelling or erythema. There  is no skin reaction. No nipple retraction.   Lab Results:  CMP      Component Value Date/Time   NA 141 09/25/2011 1600   K 3.3* 09/25/2011 1600   CL 103 09/25/2011 1600   CO2 28 09/25/2011 1600   GLUCOSE 114* 09/25/2011 1600   BUN 18 09/25/2011 1600   CREATININE 0.65 09/25/2011 1600   CALCIUM 9.3 09/25/2011 1600   PROT 7.1 09/25/2011 1600   ALBUMIN 3.6 09/25/2011 1600   AST 22 09/25/2011 1600   ALT 26 09/25/2011 1600   ALKPHOS 78 09/25/2011 1600   BILITOT 0.3 09/25/2011 1600   GFRNONAA >90 09/25/2011 1600   GFRAA >90 09/25/2011 1600    CBC Lab Results  Component Value Date   WBC 6.4 10/20/2011   HGB 13.5 10/20/2011   HCT 40.0 10/20/2011   MCV 88.5 10/20/2011   PLT 256 10/20/2011    Studies/Results:  Mm Breast Surgical Specimen  09/29/2011  *RADIOLOGY REPORT*  Clinical Data:  Known left breast cancer.  Presurgical needle localization.  NEEDLE LOCALIZATION WITH MAMMOGRAPHIC GUIDANCE AND SPECIMEN RADIOGRAPH  Patient presents for needle localization prior to surgical excision of the left breast mass.  I met with the patient and we discussed the procedure of needle localization including risks, benefits, and alternatives.  Specifically, we discussed the risks of infection, bleeding, tissue injury, and inadequate sampling. Informed written consent was given.  Using mammographic guidance, sterile technique, 2% lidocaine and a 7 cm modified Kopans needle, the clip marking the mass was localized using a medial approach.  The films are marked for Dr. Ezzard Standing.  An attempt was made to visualize the mass by ultrasound for marking the skin overlying the mass with the patient in the supine position.  However, following the patient's neoadjuvant therapy, the mass can no longer be visualized by ultrasound.  Specimen radiograph was performed at day surgery, and confirms the clip and intact wire to be present in the tissue  sample.  The specimen is marked for pathology.  IMPRESSION: Needle localization of the left breast.  No apparent complications.  Original Report Authenticated By: Rolla Plate, M.D.   Mm Breast Wire Localization Left  09/29/2011  *RADIOLOGY REPORT*  Clinical Data:  Known left breast cancer.  Presurgical needle localization.  NEEDLE LOCALIZATION WITH MAMMOGRAPHIC GUIDANCE AND SPECIMEN RADIOGRAPH  Patient presents for needle localization prior to surgical excision of the left breast mass.  I met with the patient and we discussed the procedure of needle localization including risks, benefits, and alternatives. Specifically, we discussed the risks of infection, bleeding, tissue injury, and inadequate sampling. Informed written consent was given.  Using mammographic guidance, sterile technique, 2% lidocaine and a 7 cm modified Kopans needle, the clip marking the mass was localized using a medial approach.  The films are marked for Dr. Ezzard Standing.  An attempt was made to visualize the mass by ultrasound for marking the skin overlying the mass with the patient in the supine position.  However, following the patient's neoadjuvant therapy, the mass can no longer be visualized by ultrasound.  Specimen radiograph was performed at day surgery, and confirms the clip and intact wire to be present in the tissue sample.  The specimen is marked for pathology.  IMPRESSION: Needle localization of the left breast.  No apparent complications.  Original Report Authenticated By: Rolla Plate, M.D.    Assessment:  35 year old [place] woman, BRCA-1 positive, with a complex cystic lesion involving the upper inner quadrant of the left breast measuring 4.3 cm by MRI, clinically  T2 N0, apathy showing a high-grade invasive ductal carcinoma which was triple negative with an elevated MIB-1-1, treated neoadjuvantly with  4 cycles of dose dense doxorubicin and cyclophosphamide followed by 3 of 12 planned doses of weekly paclitaxel,  discontinued because of concerns regarding neuropathy, status post definitive left lumpectomy and sentinel lymph node sampling 09/29/2011 for a residual 2.2 cm invasive ductal carcinoma, grade 3, with the single sentinel lymph node negative and adequate margins.   Plan: She is now ready to start radiation and already has an appointment with Dr. Dayton Scrape today I have made her a return appointment here for early March. We will review lab work and her overall situation at that time. We will then start long-term followup, as usual with visits every 3 months for the first 2 years followed by every 6 month visits until she completes 5 years. She is a ready scheduled for port removal next week.   She knows to call for any problems that may develop before the next visit   Jewelle Whitner C 10/20/2011

## 2011-10-20 NOTE — Progress Notes (Signed)
Please see the Nurse Progress Note in the MD Initial Consult Encounter for this patient. 

## 2011-10-22 NOTE — Progress Notes (Signed)
Encounter addended by: Amanda Pea, RN on: 10/22/2011  9:32 AM<BR>     Documentation filed: Visit Diagnoses

## 2011-10-26 ENCOUNTER — Encounter (INDEPENDENT_AMBULATORY_CARE_PROVIDER_SITE_OTHER): Payer: Self-pay

## 2011-10-26 ENCOUNTER — Encounter (HOSPITAL_BASED_OUTPATIENT_CLINIC_OR_DEPARTMENT_OTHER)
Admission: RE | Disposition: A | Discharge status: Home / Self Care | Payer: Self-pay | Source: Ambulatory Visit | Attending: Surgery

## 2011-10-26 ENCOUNTER — Encounter (HOSPITAL_BASED_OUTPATIENT_CLINIC_OR_DEPARTMENT_OTHER): Payer: Self-pay | Admitting: Surgery

## 2011-10-26 ENCOUNTER — Ambulatory Visit (HOSPITAL_BASED_OUTPATIENT_CLINIC_OR_DEPARTMENT_OTHER)
Admission: RE | Admit: 2011-10-26 | Discharge: 2011-10-26 | Disposition: A | Discharge status: Home / Self Care | Payer: BC Managed Care – PPO | Source: Ambulatory Visit | Attending: Surgery | Admitting: Surgery

## 2011-10-26 DIAGNOSIS — Z452 Encounter for adjustment and management of vascular access device: Secondary | ICD-10-CM | POA: Insufficient documentation

## 2011-10-26 DIAGNOSIS — C50919 Malignant neoplasm of unspecified site of unspecified female breast: Secondary | ICD-10-CM | POA: Insufficient documentation

## 2011-10-26 HISTORY — PX: PORT-A-CATH REMOVAL: SHX5289

## 2011-10-26 SURGERY — REMOVAL PORT-A-CATH
Anesthesia: LOCAL | Site: Chest | Laterality: Right | Wound class: Clean

## 2011-10-26 MED ORDER — SODIUM BICARBONATE 4 % IV SOLN
INTRAVENOUS | Status: DC | PRN
  Administered 2011-10-26: 5 mL via INTRAVENOUS

## 2011-10-26 MED ORDER — LIDOCAINE-EPINEPHRINE (PF) 1 %-1:200000 IJ SOLN
INTRAMUSCULAR | Status: DC | PRN
  Administered 2011-10-26: 14 mL

## 2011-10-26 SURGICAL SUPPLY — 31 items
BENZOIN TINCTURE PRP APPL 2/3 (GAUZE/BANDAGES/DRESSINGS) IMPLANT
BLADE SURG 15 STRL LF DISP TIS (BLADE) ×1 IMPLANT
BLADE SURG 15 STRL SS (BLADE) ×1
CHLORAPREP W/TINT 26ML (MISCELLANEOUS) ×2 IMPLANT
CLOTH BEACON ORANGE TIMEOUT ST (SAFETY) IMPLANT
COVER MAYO STAND STRL (DRAPES) IMPLANT
COVER SURGICAL LIGHT HANDLE (MISCELLANEOUS) ×2 IMPLANT
COVER TABLE BACK 60X90 (DRAPES) IMPLANT
DECANTER SPIKE VIAL GLASS SM (MISCELLANEOUS) IMPLANT
DERMABOND ADVANCED (GAUZE/BANDAGES/DRESSINGS) ×2
DERMABOND ADVANCED .7 DNX12 (GAUZE/BANDAGES/DRESSINGS) ×2 IMPLANT
DRAPE PED LAPAROTOMY (DRAPES) IMPLANT
DRAPE UTILITY XL STRL (DRAPES) IMPLANT
ELECT REM PT RETURN 9FT ADLT (ELECTROSURGICAL)
ELECTRODE REM PT RTRN 9FT ADLT (ELECTROSURGICAL) IMPLANT
GAUZE SPONGE 4X4 12PLY STRL LF (GAUZE/BANDAGES/DRESSINGS) IMPLANT
GLOVE BIO SURGEON STRL SZ 6.5 (GLOVE) ×4 IMPLANT
GLOVE SURG SIGNA 7.5 PF LTX (GLOVE) ×2 IMPLANT
GOWN PREVENTION PLUS XLARGE (GOWN DISPOSABLE) IMPLANT
MARKER SKIN DUAL TIP RULER LAB (MISCELLANEOUS) ×2 IMPLANT
NEEDLE HYPO 25X1 1.5 SAFETY (NEEDLE) ×2 IMPLANT
PACK BASIN DAY SURGERY FS (CUSTOM PROCEDURE TRAY) IMPLANT
PENCIL BUTTON HOLSTER BLD 10FT (ELECTRODE) IMPLANT
SLEEVE SCD COMPRESS KNEE MED (MISCELLANEOUS) IMPLANT
STRIP CLOSURE SKIN 1/4X4 (GAUZE/BANDAGES/DRESSINGS) IMPLANT
SUT VIC AB 5-0 PS2 18 (SUTURE) ×2 IMPLANT
SUT VICRYL 3-0 CR8 SH (SUTURE) ×2 IMPLANT
SYR CONTROL 10ML LL (SYRINGE) ×2 IMPLANT
TOWEL OR 17X24 6PK STRL BLUE (TOWEL DISPOSABLE) IMPLANT
TOWEL OR NON WOVEN STRL DISP B (DISPOSABLE) IMPLANT
WATER STERILE IRR 1000ML POUR (IV SOLUTION) IMPLANT

## 2011-10-26 NOTE — Interval H&P Note (Signed)
History and Physical Interval Note:  10/26/2011 2:12 PM  Crystal Lane  has presented today for surgery, with the diagnosis of unneeded port  The various methods of treatment have been discussed with the patient and family.   After consideration of risks, benefits and other options for treatment, the patient has consented to  Procedure(s): REMOVAL PORT-A-CATH as a surgical intervention .    The patients' history has been reviewed, patient examined, no change in status, stable for surgery.  I have reviewed the patients' chart and labs.  Questions were answered to the patient's satisfaction.     [name] H

## 2011-10-26 NOTE — H&P (View-Only) (Signed)
[facility]  Ovidio Kin, MD,  FACS [address]  [address] [place], [place]    [address] Phone:  [phone] FAX:  [phone]   Re:   Crystal Lane DOB:   [birth year] MRN:   [id number]  ASSESSMENT AND PLAN: 1. Left breast cancer, upper inner quadrant.   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   I reviewed the path with the patient.  She is here for her post op visit.  Finished neoadjuvant therapy by Dr. Darnelle Catalan. Developed neuropathy with Taxol. No further tx planned therapy.  Dr. Dayton Scrape to see for rad tx.   I will see her in 6 months for follow up of her breast cancer.  2. Power port placed 05/14/2011.   To remove at a time convenient with her. 3. BRCA 1 positive. Wants to try breast conservation.  4. 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  5. Has started fund raising with another patient of mine, Terrilyn Saver [phone]). 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   HISTORY OF PRESENT ILLNESS: Chief Complaint  Patient presents with  . Routine Post Op    PO lt br and PAC removal    Crystal Lane is a 35 y.o. (DOB: 1976/10/21)  white female who is a patient of JUDGE,ERIN, FNP, FNP and comes to me today for follow up of left breast lumpectomy.  For follow up of left breast lumpectomy.  She is doing well except for some numbness left upper arm.  She also had an entertaining time at our office.  The fire alarm went off and a patient in our waiting area was a little beligerant.  Path (side, TNM): left.  T2, N0. Surgery: Left breast lumpectomy, Left ax SLNBx   Date: 09/29/2011 Size of tumor: 2.2 cm  (post neoadjuvant tx) Nodes: 0/1 ER: 0% PR: 0% Ki67: 82%  HER2Neu: neg  Medical Oncologist: Magrinat  Radiation Oncologist: Dayton Scrape    PHYSICAL EXAM: BP 130/88  Pulse 76  Temp(Src) 97.8 F (36.6 C) (Temporal)  Resp 16  Ht 5\' 2"  (1.575 m)  Wt 182 lb (82.555 kg)  BMI 33.29 kg/m2  LMP 08/09/2011  HEENT:  Pupils equal.  Dentition good.  No  injury. NECK:  Supple.  No thyroid mass. LYMPH NODES:  No cervical, supraclavicular, or axillary adenopathy. BREASTS -  RIGHT:  No palpable mass or nodule.  No nipple discharge.   LEFT:  Incision in UIQ looks good.  She has some numbness under left axilla to left upper arm.  Has good ROM of left arm. UPPER EXTREMITIES:  No evidence of lymphedema.   DATA REVIEWED: Path to patient.  Ovidio Kin, MD, FACS Office:  [phone]

## 2011-10-26 NOTE — Brief Op Note (Signed)
10/26/2011  2:48 PM  PATIENT:  Crystal Lane, 35 y.o., female, MRN: [id number]  PREOP DIAGNOSIS:  unneeded port  POSTOP DIAGNOSIS:   Left breast cancer, right subclavian power port  PROCEDURE:   Procedure(s): REMOVAL PORT-A-CATH  SURGEON:   Ovidio Kin, M.D.  ASSISTANT:   none  ANESTHESIA:   local  * No anesthesia staff entered *  Local  EBL:  minimal  ml  BLOOD ADMINISTERED: none  DRAINS: none   LOCAL MEDICATIONS USED:   16 cc 1% xylocaine with epi  SPECIMEN:   no  COUNTS CORRECT:  YES  INDICATIONS FOR PROCEDURE:  [name] is a 35 y.o. (DOB: Aug 17, 1976) white female whose primary care physician is JUDGE,ERIN, FNP, FNP and comes for power port removal   The indications and risks of the surgery were explained to the patient.  The risks include, but are not limited to, infection, bleeding, and nerve injury.  Note dictated to:   #[id number]  DN  10/26/2011

## 2011-10-27 NOTE — Progress Notes (Signed)
The patient went on to say that she refuses to have surgery anywhere else.  Randa Evens and [place] were off yesterday but they were there on the 13th when she had her lumpectomy.  The pt felt as though she got wonderful care from skilled staff that really cared about her.  Monica and Windell Moulding and both [employer] are also just great.  She wanted to be sure that they were all mentioned in my note:)

## 2011-10-27 NOTE — Op Note (Signed)
NAME:  Crystal Lane, Crystal Lane                   ACCOUNT NO.:  MEDICAL RECORD NO.:  [id number]  LOCATION:                                 FACILITY:  PHYSICIAN:  Sandria Bales. Ezzard Standing, M.D.  DATE OF BIRTH:  Apr 14, 1976  DATE OF PROCEDURE:  10/26/2011                              OPERATIVE REPORT  PREOPERATIVE DIAGNOSIS:  Left breast cancer (T2, N0), Port-A-Cath.  POSTOPERATIVE DIAGNOSIS:  Left breast cancer, Power port.  PROCEDURE:  Removal of Power port.  SURGEON:  Sandria Bales. Ezzard Standing, MD.  FIRST ASSISTANT:  None.  ANESTHESIA:  16 mL of Xylocaine 1%.  COMPLICATIONS:  None.  INDICATIONS FOR PROCEDURE:  Crystal Lane is a 35 year old white female who sees Dr. Loleta Dicker, is her primary caregiver.  She has been treated with neoadjuvant therapy for left breast cancer, had a lumpectomy, and now is ready for her Power port to be removed.  She has seen Dr. Chipper Herb, anticipate radiation therapy starting on November 17, 2011.  The indications and potential complications of the procedure were explained to the patient.  Potential complications include bleeding, infection, nerve injury.  OPERATIVE NOTE:  The patient in a supine room in #3 at [facility].  Her right upper chest was prepped with ChloraPrep and sterilely draped.  I used about 16 mL of Xylocaine 1% with epinephrine.  I made an incision over the Power port and removed without difficulty.  I closed the wound in layers with 3-0 Vicryl sutures deep, 5-0 Vicryl to the skin, and painted the wound with Dermabond.  The patient tolerated the procedure well, will be discharged home today.    I will see her in followup in a 61-month visit, which will be in May.  She knows if she has any wound problems or questions, she can certainly call the office.  She actually also would be seen actively by Dr. Dayton Scrape during her radiation therapy.   Sandria Bales. Ezzard Standing, M.D., FACS   DHN/MEDQ  D:  10/26/2011  T:  10/26/2011  Job:  [id number]  cc:   Lowella Dell, M.D. Maryln Gottron, M.D. Loleta Dicker, MD

## 2011-10-29 ENCOUNTER — Encounter (HOSPITAL_BASED_OUTPATIENT_CLINIC_OR_DEPARTMENT_OTHER): Payer: Self-pay | Admitting: Surgery

## 2011-11-18 ENCOUNTER — Other Ambulatory Visit: Payer: BC Managed Care – PPO | Admitting: Lab

## 2011-11-19 ENCOUNTER — Ambulatory Visit
Admission: RE | Admit: 2011-11-19 | Discharge: 2011-11-19 | Disposition: A | Discharge status: Home / Self Care | Payer: BC Managed Care – PPO | Source: Ambulatory Visit | Attending: Radiation Oncology | Admitting: Radiation Oncology

## 2011-11-19 DIAGNOSIS — C50919 Malignant neoplasm of unspecified site of unspecified female breast: Secondary | ICD-10-CM

## 2011-11-19 NOTE — Progress Notes (Signed)
Met with patient to discuss RO billing. Pt has an Occupational psychologist..   Dx: 174.2 Upper-inner quadrant of breast Attending Rad: Dr. Dayton Scrape  Rad Tx: ([id number]) Extrl Beam

## 2011-11-19 NOTE — Progress Notes (Signed)
Simulation/treatment planning note  The patient underwent simulation/treatment planning in the management of her stage II (T2, N0, M0) invasive ductal carcinoma of the left breast. She was set up a custom breast board with a custom neck mold for immobilization. Her partial mastectomy scar along her left breast was marked with a radiopaque wire. I also marked her superior, inferior, medial, and lateral borders. Our initial plan was to consider breath-hold, however her heart was right of Center, and breath-hold was not felt to be necessary. She was then scanned. I chose and isocenter. She was set up to medial and lateral left breast tangents. 2 separate multileaf collimator settings were designed to conform the field. Her normal anatomy including her tumor bed were contoured. I prescribing 5000 cGy in 25 sessions utilizing 6 MV photons. Those be followed by a boost for a further 1000 cGy utilizing 15 MEV electrons. Dosimetry, and an isodose plan is requested.

## 2011-11-24 ENCOUNTER — Encounter: Payer: Self-pay | Admitting: Oncology

## 2011-11-24 NOTE — Progress Notes (Signed)
Faxed disability form and clinical information to [employer] @ [phone].

## 2011-11-26 ENCOUNTER — Ambulatory Visit
Admission: RE | Admit: 2011-11-26 | Discharge: 2011-11-26 | Disposition: A | Discharge status: Home / Self Care | Payer: BC Managed Care – PPO | Source: Ambulatory Visit | Attending: Radiation Oncology | Admitting: Radiation Oncology

## 2011-11-26 DIAGNOSIS — C50919 Malignant neoplasm of unspecified site of unspecified female breast: Secondary | ICD-10-CM

## 2011-11-26 NOTE — Procedures (Signed)
[name] underwent simulation verification for treatment to her left breast.  Her isocenter needs to be moved posteriorly, approximately 3-4 mm.  The multileaf collimators contoured the intended treatment volume appropriately.  I am requesting a repeat lateral set up image and also a repeat tangent setup.  Otherwise, the intended treatment volumes and multileaf collimators contoured the fields appropriately.    ______________________________ Maryln Gottron, M.D. RJM/MEDQ  D:  11/26/2011  T:  11/26/2011  Job:  [id number]

## 2011-11-30 ENCOUNTER — Ambulatory Visit
Admission: RE | Admit: 2011-11-30 | Discharge: 2011-11-30 | Disposition: A | Discharge status: Home / Self Care | Payer: BC Managed Care – PPO | Source: Ambulatory Visit | Attending: Radiation Oncology | Admitting: Radiation Oncology

## 2011-11-30 ENCOUNTER — Encounter: Payer: Self-pay | Admitting: Radiation Oncology

## 2011-11-30 VITALS — BP 117/81 | HR 71 | Resp 18 | Wt 184.0 lb

## 2011-11-30 DIAGNOSIS — C50919 Malignant neoplasm of unspecified site of unspecified female breast: Secondary | ICD-10-CM

## 2011-11-30 NOTE — Progress Notes (Signed)
Patient presents to the clinic today unaccompanied for under treat visit with Dr. Dayton Scrape. Patient is alert and oriented to person, place, and time. No distress noted. Steady gait noted. Pleasant affect noted. Patient reports brief episodes o sharp shooting pain in her left breast. Patient reports her port was removed 10/26/2011. Also, patient reports taking amoxicillin 875 mg bid for ten day for a sinus infection. Patient has no other complaints.

## 2011-11-30 NOTE — Progress Notes (Signed)
Weekly Management Note:  Site:L Breast Current Dose:  200  cGy Projected Dose: 6000  cGy  Narrative: The patient is seen today for routine under treatment assessment. CBCT/MVCT images/port films were reviewed. The chart was reviewed.   No complaints today. She will have her post sim education later this week.  Physical Examination:  Filed Vitals:   11/30/11 1629  BP: 117/81  Pulse: 71  Resp: 18  .  Weight: 184 lb (83.462 kg). No skin changes.  Impression: Tolerating radiation therapy well.  Plan: Continue radiation therapy as planned.

## 2011-12-01 ENCOUNTER — Ambulatory Visit
Admission: RE | Admit: 2011-12-01 | Discharge: 2011-12-01 | Disposition: A | Discharge status: Home / Self Care | Payer: BC Managed Care – PPO | Source: Ambulatory Visit | Attending: Radiation Oncology | Admitting: Radiation Oncology

## 2011-12-01 DIAGNOSIS — C50919 Malignant neoplasm of unspecified site of unspecified female breast: Secondary | ICD-10-CM

## 2011-12-01 MED ORDER — ALRA NON-METALLIC DEODORANT (RAD-ONC)
1.0000 "application " | Freq: Once | TOPICAL | Status: AC
  Administered 2011-12-01: 1 via TOPICAL

## 2011-12-01 MED ORDER — RADIAPLEXRX EX GEL
Freq: Once | CUTANEOUS | Status: AC
  Administered 2011-12-01: 16:00:00 via TOPICAL

## 2011-12-01 NOTE — Progress Notes (Signed)
Patient presented to the clinic today accompanied by her boyfriend for post sim education with Lelon Mast, Charity fundraiser. Patient is alert and oriented to person, place, and time. No distress noted. Steady gait noted. Pleasant affect noted. Patient denies pain at this time. Oriented patient to staff and routine of the clinic. Provided patient with RADIATION THERAPY AND YOU handbook. Reviewed pertinent information within the handbook with the patient. Provided patient with Radiaplex and Alra skin care products. Reviewed use of these products. Provided patient with business card and encouraged contact for needs. Patient verbalized understanding of all reviewed.

## 2011-12-02 ENCOUNTER — Ambulatory Visit
Admission: RE | Admit: 2011-12-02 | Discharge: 2011-12-02 | Disposition: A | Discharge status: Home / Self Care | Payer: BC Managed Care – PPO | Source: Ambulatory Visit | Attending: Radiation Oncology | Admitting: Radiation Oncology

## 2011-12-03 ENCOUNTER — Ambulatory Visit
Admission: RE | Admit: 2011-12-03 | Discharge: 2011-12-03 | Disposition: A | Discharge status: Home / Self Care | Payer: BC Managed Care – PPO | Source: Ambulatory Visit | Attending: Radiation Oncology | Admitting: Radiation Oncology

## 2011-12-04 ENCOUNTER — Ambulatory Visit
Admission: RE | Admit: 2011-12-04 | Discharge: 2011-12-04 | Disposition: A | Discharge status: Home / Self Care | Payer: BC Managed Care – PPO | Source: Ambulatory Visit | Attending: Radiation Oncology | Admitting: Radiation Oncology

## 2011-12-07 ENCOUNTER — Ambulatory Visit
Admission: RE | Admit: 2011-12-07 | Discharge: 2011-12-07 | Disposition: A | Discharge status: Home / Self Care | Payer: BC Managed Care – PPO | Source: Ambulatory Visit | Attending: Radiation Oncology | Admitting: Radiation Oncology

## 2011-12-07 ENCOUNTER — Encounter: Payer: Self-pay | Admitting: Radiation Oncology

## 2011-12-07 VITALS — BP 125/82 | HR 83 | Resp 18 | Wt 182.0 lb

## 2011-12-07 DIAGNOSIS — C50919 Malignant neoplasm of unspecified site of unspecified female breast: Secondary | ICD-10-CM

## 2011-12-07 NOTE — Progress Notes (Signed)
Weekly Management Note:  Site:L Breast Current Dose:  1200  cGy Projected Dose: 6000  cGy  Narrative: The patient is seen today for routine under treatment assessment. CBCT/MVCT images/port films were reviewed. The chart was reviewed.   No complaints today. She uses Radioplex gel when necessary.  Physical Examination:  Filed Vitals:   12/07/11 1635  BP: 125/82  Pulse: 83  Resp: 18  .  Weight: 182 lb (82.555 kg). No significant skin changes.  Impression: Tolerating radiation therapy well.  Plan: Continue radiation therapy as planned.

## 2011-12-07 NOTE — Progress Notes (Signed)
Patient presents to the clinic today unaccompanied for under treat visit with Dr. Dayton Scrape. Patient is alert and oriented to person, place, and time. No distress noted. Steady gait noted. Pleasant affect noted. Patient denies pain at this time. However, patient reports intermittent brief sharp shooting pain in her left breast. Patient has no complaints at this time. Reported all findings to Dr. Dayton Scrape.

## 2011-12-08 ENCOUNTER — Ambulatory Visit
Admission: RE | Admit: 2011-12-08 | Discharge: 2011-12-08 | Disposition: A | Discharge status: Home / Self Care | Payer: BC Managed Care – PPO | Source: Ambulatory Visit | Attending: Radiation Oncology | Admitting: Radiation Oncology

## 2011-12-09 ENCOUNTER — Ambulatory Visit
Admission: RE | Admit: 2011-12-09 | Discharge: 2011-12-09 | Disposition: A | Discharge status: Home / Self Care | Payer: BC Managed Care – PPO | Source: Ambulatory Visit | Attending: Radiation Oncology | Admitting: Radiation Oncology

## 2011-12-10 ENCOUNTER — Ambulatory Visit
Admission: RE | Admit: 2011-12-10 | Discharge: 2011-12-10 | Disposition: A | Discharge status: Home / Self Care | Payer: BC Managed Care – PPO | Source: Ambulatory Visit | Attending: Radiation Oncology | Admitting: Radiation Oncology

## 2011-12-11 ENCOUNTER — Ambulatory Visit
Admission: RE | Admit: 2011-12-11 | Discharge: 2011-12-11 | Disposition: A | Discharge status: Home / Self Care | Payer: BC Managed Care – PPO | Source: Ambulatory Visit | Attending: Radiation Oncology | Admitting: Radiation Oncology

## 2011-12-14 ENCOUNTER — Encounter: Payer: Self-pay | Admitting: Radiation Oncology

## 2011-12-14 ENCOUNTER — Ambulatory Visit
Admission: RE | Admit: 2011-12-14 | Discharge: 2011-12-14 | Disposition: A | Discharge status: Home / Self Care | Payer: BC Managed Care – PPO | Source: Ambulatory Visit | Attending: Radiation Oncology | Admitting: Radiation Oncology

## 2011-12-14 VITALS — BP 103/72 | HR 73 | Resp 18 | Wt 182.5 lb

## 2011-12-14 DIAGNOSIS — C50919 Malignant neoplasm of unspecified site of unspecified female breast: Secondary | ICD-10-CM

## 2011-12-14 NOTE — Progress Notes (Signed)
Weekly Management Note:  Site:L Breast Current Dose:  2200  cGy Projected Dose: 6000  cGy  Narrative: The patient is seen today for routine under treatment assessment. CBCT/MVCT images/port films were reviewed. The chart was reviewed.   No complaints today. She has Radioplex gel to use when necessary.  Physical Examination:  Filed Vitals:   12/14/11 1634  BP: 103/72  Pulse: 73  Resp: 18  .  Weight: 182 lb 8 oz (82.781 kg). Faint erythema the skin with no areas of desquamation.  Impression: Tolerating radiation therapy well.  Plan: Continue radiation therapy as planned.

## 2011-12-14 NOTE — Progress Notes (Signed)
Patient presents to the clinic today unaccompanied for under treat visit with Dr. Dayton Scrape. Patient is alert and oriented to person, place, and time. No distress noted. Steady gait noted. Pleasant affect noted. Patient denies pain at this time. Patient has no complaints. Patient denies nausea, vomiting, or diarrhea. Patient reports a headache today but, relates it to stress. Reported all findings to Dr. Dayton Scrape.

## 2011-12-15 ENCOUNTER — Ambulatory Visit
Admission: RE | Admit: 2011-12-15 | Discharge: 2011-12-15 | Disposition: A | Discharge status: Home / Self Care | Payer: BC Managed Care – PPO | Source: Ambulatory Visit | Attending: Radiation Oncology | Admitting: Radiation Oncology

## 2011-12-16 ENCOUNTER — Ambulatory Visit
Admission: RE | Admit: 2011-12-16 | Discharge: 2011-12-16 | Disposition: A | Discharge status: Home / Self Care | Payer: BC Managed Care – PPO | Source: Ambulatory Visit | Attending: Radiation Oncology | Admitting: Radiation Oncology

## 2011-12-17 ENCOUNTER — Ambulatory Visit
Admission: RE | Admit: 2011-12-17 | Discharge: 2011-12-17 | Disposition: A | Discharge status: Home / Self Care | Payer: BC Managed Care – PPO | Source: Ambulatory Visit | Attending: Radiation Oncology | Admitting: Radiation Oncology

## 2011-12-18 ENCOUNTER — Ambulatory Visit
Admission: RE | Admit: 2011-12-18 | Discharge: 2011-12-18 | Disposition: A | Discharge status: Home / Self Care | Payer: BC Managed Care – PPO | Source: Ambulatory Visit | Attending: Radiation Oncology | Admitting: Radiation Oncology

## 2011-12-21 ENCOUNTER — Ambulatory Visit
Admission: RE | Admit: 2011-12-21 | Discharge: 2011-12-21 | Disposition: A | Discharge status: Home / Self Care | Payer: BC Managed Care – PPO | Source: Ambulatory Visit | Attending: Radiation Oncology | Admitting: Radiation Oncology

## 2011-12-21 ENCOUNTER — Encounter: Payer: Self-pay | Admitting: Radiation Oncology

## 2011-12-21 VITALS — BP 117/71 | HR 70 | Resp 18 | Wt 183.5 lb

## 2011-12-21 DIAGNOSIS — C50919 Malignant neoplasm of unspecified site of unspecified female breast: Secondary | ICD-10-CM

## 2011-12-21 NOTE — Progress Notes (Signed)
Patient presents to the clinic today for under treat visit with Dr. Dayton Scrape prior to treatment because she is going out of town today. Patient is alert and oriented to person, place, and time. No distress noted. Steady gait noted. Pleasant affect noted. Patient reports discomfort 2 on a scale of 0-10 under her left axilla with intermittent sharp shooting pains 10 on a scale of 0-10. Hyperpigmentation of the left breast noted without breaks in the skin. Also, patient reports posterior of left upper arm "feel numb." Patient reports that the numb feeling went away shortly after surgery but returned the latter part of last week. Patient reports bilateral edema of hands. Reported all findings to Dr. Dayton Scrape.

## 2011-12-21 NOTE — Progress Notes (Signed)
Weekly Management Note:  Site:L Breast Current Dose:  3200  cGy Projected Dose: 6000  cGy  Narrative: The patient is seen today for routine under treatment assessment. CBCT/MVCT images/port films were reviewed. The chart was reviewed.   She does report swelling of her hands with difficulty wearing her rings. She did have a fair amount of salt intake over the weekend with the [employer]. She also reports worsening numbness along her left axilla which had been improving.  Physical Examination:  Filed Vitals:   12/21/11 1001  BP: 117/71  Pulse: 70  Resp: 18  .  Weight: 183 lb 8 oz (83.235 kg). There is no obvious lymphedema on the left arm. There is no axillary fluid or mass.  Impression: Tolerating radiation therapy well. However, other that her hand swelling is probably related to excess salt intake over the weekend. This may also explain her left axillary numbness. Her numbers could related to her opposition to her therapy.  Plan: Continue radiation therapy as planned. I told her to limit her salt intake and we would reevaluate her symptoms next week.

## 2011-12-22 ENCOUNTER — Ambulatory Visit
Admission: RE | Admit: 2011-12-22 | Discharge: 2011-12-22 | Disposition: A | Discharge status: Home / Self Care | Payer: BC Managed Care – PPO | Source: Ambulatory Visit | Attending: Radiation Oncology | Admitting: Radiation Oncology

## 2011-12-23 ENCOUNTER — Ambulatory Visit
Admission: RE | Admit: 2011-12-23 | Discharge: 2011-12-23 | Disposition: A | Discharge status: Home / Self Care | Payer: BC Managed Care – PPO | Source: Ambulatory Visit | Attending: Radiation Oncology | Admitting: Radiation Oncology

## 2011-12-24 ENCOUNTER — Encounter: Payer: Self-pay | Admitting: Radiation Oncology

## 2011-12-24 ENCOUNTER — Ambulatory Visit
Admission: RE | Admit: 2011-12-24 | Discharge: 2011-12-24 | Disposition: A | Discharge status: Home / Self Care | Payer: BC Managed Care – PPO | Source: Ambulatory Visit | Attending: Radiation Oncology | Admitting: Radiation Oncology

## 2011-12-24 DIAGNOSIS — C50919 Malignant neoplasm of unspecified site of unspecified female breast: Secondary | ICD-10-CM

## 2011-12-24 MED ORDER — RADIAPLEXRX EX GEL
Freq: Once | CUTANEOUS | Status: AC
  Administered 2011-12-24: 15:00:00 via TOPICAL

## 2011-12-24 MED ORDER — BIAFINE EX EMUL
Freq: Every day | CUTANEOUS | Status: DC
Start: 2011-12-24 — End: 2011-12-25
  Administered 2011-12-24: 17:00:00 via TOPICAL

## 2011-12-24 NOTE — Progress Notes (Signed)
Electron beam simulation note: The patient underwent virtual simulation for her left breast electron beam boost. She was setup en face. One custom block was constructed to shield the normal shredding structures. A special port plan is requested. I am prescribing 1000 cGy in 5 sessions utilizing 15 MEV electrons. Electron beam energy was chosen based on the depth of her tumor bed as seen on her CT scan.

## 2011-12-25 ENCOUNTER — Ambulatory Visit
Admission: RE | Admit: 2011-12-25 | Discharge: 2011-12-25 | Disposition: A | Discharge status: Home / Self Care | Payer: BC Managed Care – PPO | Source: Ambulatory Visit | Attending: Radiation Oncology | Admitting: Radiation Oncology

## 2011-12-28 ENCOUNTER — Ambulatory Visit
Admission: RE | Admit: 2011-12-28 | Discharge: 2011-12-28 | Disposition: A | Discharge status: Home / Self Care | Payer: BC Managed Care – PPO | Source: Ambulatory Visit | Attending: Radiation Oncology | Admitting: Radiation Oncology

## 2011-12-28 ENCOUNTER — Encounter: Payer: Self-pay | Admitting: Radiation Oncology

## 2011-12-28 VITALS — BP 122/79 | HR 79 | Resp 18 | Wt 182.9 lb

## 2011-12-28 DIAGNOSIS — C50919 Malignant neoplasm of unspecified site of unspecified female breast: Secondary | ICD-10-CM

## 2011-12-28 NOTE — Progress Notes (Signed)
Patient presents to the clinic today accompanied by her mother for an under treat visit with Dr. Dayton Scrape. Patient is alert and oriented to person, place, and time. No distress noted. Steady gait noted. Pleasant affect noted. Patient denies pain at this time. Hyperpigmentation of the left/treated breast with mild radiation rash noted. Patient reports that Biafine relieves the itch from the rash. Patient reports that Saturday she had a migraine headache but, reports that she had not been resting well and stressful week at work attributed. Patient has no other complaints.

## 2011-12-28 NOTE — Progress Notes (Signed)
Weekly Management Note:  Site:L Breast Current Dose:  4200  cGy Projected Dose: 6000  cGy  Narrative: The patient is seen today for routine under treatment assessment. CBCT/MVCT images/port films were reviewed. The chart was reviewed.   No complaints today. She is using Biafine cream as needed.  Physical Examination:  Filed Vitals:   12/28/11 1628  BP: 122/79  Pulse: 79  Resp: 18  .  Weight: 182 lb 14.4 oz (82.963 kg). There is mild to moderate erythema the skin within the left breast treatment field with no areas of desquamation.  Impression: Tolerating radiation therapy well.  Plan: Continue radiation therapy as planned.

## 2011-12-29 ENCOUNTER — Ambulatory Visit
Admission: RE | Admit: 2011-12-29 | Discharge: 2011-12-29 | Disposition: A | Discharge status: Home / Self Care | Payer: BC Managed Care – PPO | Source: Ambulatory Visit | Attending: Radiation Oncology | Admitting: Radiation Oncology

## 2011-12-30 ENCOUNTER — Ambulatory Visit
Admission: RE | Admit: 2011-12-30 | Discharge: 2011-12-30 | Disposition: A | Discharge status: Home / Self Care | Payer: BC Managed Care – PPO | Source: Ambulatory Visit | Attending: Radiation Oncology | Admitting: Radiation Oncology

## 2011-12-31 ENCOUNTER — Ambulatory Visit
Admission: RE | Admit: 2011-12-31 | Discharge: 2011-12-31 | Disposition: A | Discharge status: Home / Self Care | Payer: BC Managed Care – PPO | Source: Ambulatory Visit | Attending: Radiation Oncology | Admitting: Radiation Oncology

## 2012-01-01 ENCOUNTER — Ambulatory Visit
Admission: RE | Admit: 2012-01-01 | Discharge: 2012-01-01 | Disposition: A | Discharge status: Home / Self Care | Payer: BC Managed Care – PPO | Source: Ambulatory Visit | Attending: Radiation Oncology | Admitting: Radiation Oncology

## 2012-01-04 ENCOUNTER — Ambulatory Visit
Admission: RE | Admit: 2012-01-04 | Discharge: 2012-01-04 | Disposition: A | Discharge status: Home / Self Care | Payer: BC Managed Care – PPO | Source: Ambulatory Visit | Attending: Radiation Oncology | Admitting: Radiation Oncology

## 2012-01-04 DIAGNOSIS — C50919 Malignant neoplasm of unspecified site of unspecified female breast: Secondary | ICD-10-CM

## 2012-01-04 NOTE — Progress Notes (Signed)
Here for routine weekly under treat  Of left  breast . Has completed 25/25 left breast tx. And 1/5 left breast boost. Skin hyperpigmented red. Using biafine. For itching using hydrocortisone 1%.

## 2012-01-04 NOTE — Progress Notes (Signed)
Weekly Management Note:  Site:L Breast Current Dose:  5200  cGy Projected Dose: 6000  cGy  Narrative: The patient is seen today for routine under treatment assessment. CBCT/MVCT images/port films were reviewed. The chart was reviewed.  She is without complaints today. She uses Biafine cream when necessary.  Physical Examination: There were no vitals filed for this visit..  Weight:  . There is diffuse, confluent erythema left breast with no areas of desquamation.  Impression: Tolerating radiation therapy well.  Plan: Continue radiation therapy as planned. I will see her for a one-month followup visit after completion of radiation therapy later this week.

## 2012-01-05 ENCOUNTER — Ambulatory Visit
Admission: RE | Admit: 2012-01-05 | Discharge: 2012-01-05 | Disposition: A | Discharge status: Home / Self Care | Payer: BC Managed Care – PPO | Source: Ambulatory Visit | Attending: Radiation Oncology | Admitting: Radiation Oncology

## 2012-01-06 ENCOUNTER — Ambulatory Visit
Admission: RE | Admit: 2012-01-06 | Discharge: 2012-01-06 | Disposition: A | Discharge status: Home / Self Care | Payer: BC Managed Care – PPO | Source: Ambulatory Visit | Attending: Radiation Oncology | Admitting: Radiation Oncology

## 2012-01-07 ENCOUNTER — Ambulatory Visit
Admission: RE | Admit: 2012-01-07 | Discharge: 2012-01-07 | Disposition: A | Discharge status: Home / Self Care | Payer: BC Managed Care – PPO | Source: Ambulatory Visit | Attending: Radiation Oncology | Admitting: Radiation Oncology

## 2012-01-08 ENCOUNTER — Ambulatory Visit
Admission: RE | Admit: 2012-01-08 | Discharge: 2012-01-08 | Disposition: A | Discharge status: Home / Self Care | Payer: BC Managed Care – PPO | Source: Ambulatory Visit | Attending: Radiation Oncology | Admitting: Radiation Oncology

## 2012-01-09 ENCOUNTER — Encounter: Payer: Self-pay | Admitting: Radiation Oncology

## 2012-01-09 NOTE — Progress Notes (Signed)
[facility] Radiation Oncology  Name:Crystal Lane  Date:01/09/2012           EAV:[id number] DOB:1976-03-13   Status:outpatient    CC: Loleta Dicker, FNP, FNP  Dr. Ovidio Kin, Dr. Marikay Alar Magrinat  REFERRING PHYSICIAN: Dr. 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 (She did not need deep inspiration and breath to avoid her heart). 15 MEV electrons, left breast boost                NARRATIVE: She tolerated her treatments beautifully and used Radioplex gel when necessary.                            PLAN: Routine followup in one month. Patient instructed to call if questions or worsening complaints in interim.

## 2012-01-18 ENCOUNTER — Other Ambulatory Visit: Payer: BC Managed Care – PPO | Admitting: Lab

## 2012-01-26 ENCOUNTER — Other Ambulatory Visit (HOSPITAL_BASED_OUTPATIENT_CLINIC_OR_DEPARTMENT_OTHER): Payer: BC Managed Care – PPO

## 2012-01-26 ENCOUNTER — Ambulatory Visit (HOSPITAL_BASED_OUTPATIENT_CLINIC_OR_DEPARTMENT_OTHER): Payer: BC Managed Care – PPO | Admitting: Oncology

## 2012-01-26 VITALS — BP 106/74 | HR 90 | Temp 98.5°F | Ht 61.5 in | Wt 180.2 lb

## 2012-01-26 DIAGNOSIS — C50919 Malignant neoplasm of unspecified site of unspecified female breast: Secondary | ICD-10-CM

## 2012-01-26 LAB — COMPREHENSIVE METABOLIC PANEL
ALT: 12 U/L (ref 0–35)
Alkaline Phosphatase: 86 U/L (ref 39–117)
CO2: 23 mEq/L (ref 19–32)
Creatinine, Ser: 0.69 mg/dL (ref 0.50–1.10)
Glucose, Bld: 92 mg/dL (ref 70–99)
Total Bilirubin: 0.3 mg/dL (ref 0.3–1.2)

## 2012-01-26 LAB — CBC WITH DIFFERENTIAL/PLATELET
BASO%: 0.1 % (ref 0.0–2.0)
HCT: 39.5 % (ref 34.8–46.6)
LYMPH%: 8.9 % — ABNORMAL LOW (ref 14.0–49.7)
MCHC: 33.5 g/dL (ref 31.5–36.0)
MCV: 86.8 fL (ref 79.5–101.0)
MONO#: 0.5 10*3/uL (ref 0.1–0.9)
MONO%: 5.3 % (ref 0.0–14.0)
NEUT%: 85.1 % — ABNORMAL HIGH (ref 38.4–76.8)
Platelets: 297 10*3/uL (ref 145–400)
WBC: 8.6 10*3/uL (ref 3.9–10.3)

## 2012-01-26 LAB — CANCER ANTIGEN 27.29: CA 27.29: 18 U/mL (ref 0–39)

## 2012-01-26 NOTE — Progress Notes (Signed)
ID: Crystal Lane   DOB: 1976-07-13  MR#: [id number]  WUJ#:[id number]  HISTORY OF PRESENT ILLNESS: Crystal Lane found a mass in her left breast in January 2012.  She brought it to [employer] (Publishing rights manager, at [facility]) attention, but she says by the time she got to the clinic after a couple of weeks the mass had pretty much cleared.  It came back in Crystal Lane, however, and this time she was referred for mammography where a study 02/16/2011 showed (per Dr. Logan Bores' notes, I do not have the actual report) a 4.2-cm lobulated mass as well as an enlarged left axillary node.  By ultrasound, the mass was partly solid partly cystic with a thickened.  Ultrasound of the left axilla showed a 1.7-cm node with an eccentrically thickened cortex.     On 5/11 the patient underwent ultrasound-guided Lane core biopsy of the left breast mass, following aspiration of 10 mL of straw-colored fluid.  The pathology [id number] at [facility] in [place]) showed a high-grade invasive ductal carcinoma which was estrogen and progesterone negative, with no HER-2/neu amplification.  The MIB-1 was 82%.  With this information, the patient was referred to [facility] and she was set up for breast MRI 5/16, showing heterogeneously dense breasts, but in particular in the upper inner quadrant of the left breast a complex multilobulated partly cystic mass with a thick enhancing wall measuring altogether 4.3 cm.  The thickened wall of the lesion had rapid enhancement and washout, and the posterior margin of the lesion was close to but did not quite abut the pectoralis.  There was what looks like an intramammary lymph node in the posterior lateral left breast at the level of the nipple and several normal-appearing left axillary nodes, but a 2-cm left axillary lymph node with an eccentrically thickened cortex was again noted.  The rest of the exam including the left internal mammary nodal area and the right  side were unremarkable.  Since that procedure, the patient had a fine-Lane aspiration of the suspicious left axillary lymph node whichwas benign, and she also had genetic testing which shows her to be BRCA-1 positive. She was treated neoadjuvantly as detailed below followed by surgery and radiation treatments completed February 2013  INTERVAL HISTORY: Crystal Lane returns today with her mother Crystal Lane for followup of Crystal Lane's breast cancer. She completed her radiation treatments on 03/07/2012. She did very well with them, continue to work full-time, and had only mild dry desquamation and erythema by her report.  REVIEW OF SYSTEMS: Her periods have resumed in fact it is the second day of her period. She is not having any hot flashes or any perimenstrual symptoms. She did have sinus and ear infection which was treated through her primary care physician initially with Biaxin and later with amoxicillin. This "knocked her down" for a few days, but she is now back to walking 30 minutes daily and planning to get back to the gym 3 times a week. Her goal is to lose "to 30 pounds I gained during treatment". Incidentally she has bought a house and [place] and has a Nurse, mental health, Armed forces operational officer I., who weighs 7 pounds and looks very smart by the photos.  A detailed review of systems was otherwise noncontributory  PAST MEDICAL HISTORY: Past Medical History  Diagnosis Date  . Complication of anesthesia     excessive talking while sedated  . Anxiety     due to dx. of breast CA  . Seasonal allergies  current non-prod. cough, congestion  . Breast cancer     left    PAST SURGICAL HISTORY: Past Surgical History  Procedure Date  . Appendectomy 09/02/2005  . Portacath placement 6/12  . Breast lumpectomy w/ Lane localization 09/29/11    left /SLNBx  . Breast lumpectomy 09/29/2011    Procedure: BREAST LUMPECTOMY WITH EXCISION OF SENTINEL NODE;  Surgeon: Kandis Cocking, MD;  Location: [facility];  Service:  General;  Laterality: Left;  Lane localization @ [facility] 7:30, SLNbx(nuc med  9:30)  . Port-a-cath removal 10/26/2011    Procedure: REMOVAL PORT-A-CATH;  Surgeon: Kandis Cocking, MD;  Location: [facility];  Service: General;  Laterality: Right;    FAMILY HISTORY Family History  Problem Relation Age of Onset  . Diabetes Father   . Hyperlipidemia Father   The patient's mother is alive and present today.  She has a history of CIS of the cervix, status post laser treatment. The patient's mother has 2 sisters.  Neither the patient's mother nor the patient's mother's sisters, nor anyone in the patient's mother's family to their knowledge has any history of breast or ovarian cancer.  The patient's father is alive.  His mother may or may not have had ovarian cancer remotely.  She now has Alzheimer disease, and there are no records.  The patient's father had 2 sisters; currently age 51 and 71, neither of them have had breast or ovarian cancer.  GYNECOLOGIC HISTORY:  Crystal Lane had menarche at age 39.  Last menstrual period was 5/18.  Her periods are regular every 24-25 days.  Only 1 day is heavy, and she has some breast tenderness as the only associated symptom.  She is G0.  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  She separated from her husband, Crystal Lane, in January.  He is present today, however, and is part of her "support team."  Crystal Lane works in Arts administrator.  The patient's mother, Crystal Lane, is also present today.  She teaches reading in elementary school.  The patient attends a [employer] locally.   ADVANCED DIRECTIVES:  HEALTH MAINTENANCE: History  Substance Use Topics  . Smoking status: Never Smoker   . Smokeless tobacco: Never Used  . Alcohol Use: 0.0 oz/week     rare     Colonoscopy:  PAP:  Bone density:  Lipid panel:  Allergies  Allergen Reactions  . Betadine (Povidone Iodine) Nausea  And Vomiting    The smell causes N/V  . Compazine Other (See Comments)    Causes patient to "pass out"  . Gelatin (Fd&C Blue #1-Gelatin)     Ultrasound gel causes pt to have a rash     Current Outpatient Prescriptions  Medication Sig Dispense Refill  . ALPRAZolam (XANAX) 0.5 MG tablet Take 0.5 mg by mouth at bedtime as needed.        Marland Kitchen amoxicillin (AMOXIL) 875 MG tablet Take 875 mg by mouth 2 (two) times daily.      . non-metallic deodorant Thornton Papas) MISC Apply 1 application topically daily as needed.      . Wound Cleansers (RADIAPLEX EX) Apply topically.        OBJECTIVE: Young white woman who appears healthy Filed Vitals:   01/26/12 1457  BP: 106/74  Pulse: 90  Temp: 98.5 F (36.9 C)     Body mass index is 33.50 kg/(m^2).    ECOG FS: 0  Sclerae unicteric  Oropharynx clear No peripheral adenopathy Lungs no rales or rhonchi Heart regular rate and rhythm Abd benign MSK no focal spinal tenderness, no peripheral edema Neuro: nonfocal Breasts: Right breast no suspicious findings; left breast status post lumpectomy and radiation. There is some erythema, but no desquamation, and no suspicious masses. There is no evidence of recurrence  LAB RESULTS: Lab Results  Component Value Date   WBC 8.6 01/26/2012   NEUTROABS 7.3* 01/26/2012   HGB 13.2 01/26/2012   HCT 39.5 01/26/2012   MCV 86.8 01/26/2012   PLT 297 01/26/2012      Chemistry      Component Value Date/Time   NA 141 09/25/2011 1600   K 3.3* 09/25/2011 1600   CL 103 09/25/2011 1600   CO2 28 09/25/2011 1600   BUN 18 09/25/2011 1600   CREATININE 0.65 09/25/2011 1600      Component Value Date/Time   CALCIUM 9.3 09/25/2011 1600   ALKPHOS 78 09/25/2011 1600   AST 22 09/25/2011 1600   ALT 26 09/25/2011 1600   BILITOT 0.3 09/25/2011 1600       Lab Results  Component Value Date   LABCA2 24 04/22/2011    No components found with this basename: [id number]    No results found for this basename: INR:1;PROTIME:1 in the last 168  hours  Urinalysis No results found for this basename: colorurine, appearanceur, labspec, phurine, glucoseu, hgbur, bilirubinur, ketonesur, proteinur, urobilinogen, nitrite, leukocytesur       STUDIES: No results found.  ASSESSMENT: 36 year old [place] woman, BRCA-1 positive, with a complex cystic lesion involving the upper inner quadrant of the left breast measuring 4.3 cm by MRI, clinically T2 N0, biopsy showing a high-grade invasive ductal carcinoma which was triple negative with an elevated MIB-1, treated neoadjuvantly with 4 cycles of dose dense doxorubicin and cyclophosphamide followed by 3 of 12 planned doses of weekly paclitaxel, discontinued because of concerns regarding neuropathy, status post definitive left lumpectomy and sentinel lymph node sampling 09/29/2011 for a residual ypT1c ypN0 invasive ductal carcinoma, grade 3, followed by radiation completed February of 2012   PLAN: Carlyann is doing very well overall, and doesn't show signs of posttraumatic stress at this point. We did talk about the very vulnerable period that usually occurs at this time and she is aware of it. On the other hand she is planning a trip to the [place] in late May "to help me get over everything". She has her new dog and her new house and she just finalized her divorce yesterday. These are a lot of changes in one year.  She knows that at some point she will have to have her ovaries removed but she would like to keep open the possibility of getting pregnant at some point. I am going to refer her to a gynecologist to begin a followup, and she tells me she would prefer to woman. She is ready scheduled to see Dr. Dayton Scrape later this month and Dr. Ezzard Standing next month. She is going to see me in June after her MRI and mammography earlier that month. She knows to call for any problems that may develop before the next visit.  Suheyb Raucci C    01/26/2012

## 2012-01-29 ENCOUNTER — Telehealth: Payer: Self-pay | Admitting: *Deleted

## 2012-01-29 NOTE — Telephone Encounter (Signed)
made patient a mammogram appointment for 07-2012 per [facility] she will call the patient and set up the mri of the breast

## 2012-02-04 ENCOUNTER — Ambulatory Visit
Admission: RE | Admit: 2012-02-04 | Discharge: 2012-02-04 | Disposition: A | Discharge status: Home / Self Care | Payer: BC Managed Care – PPO | Source: Ambulatory Visit | Attending: Radiation Oncology | Admitting: Radiation Oncology

## 2012-02-04 ENCOUNTER — Other Ambulatory Visit: Payer: Self-pay | Admitting: Oncology

## 2012-02-04 VITALS — BP 109/73 | HR 91 | Temp 98.3°F | Wt 178.7 lb

## 2012-02-04 DIAGNOSIS — C50919 Malignant neoplasm of unspecified site of unspecified female breast: Secondary | ICD-10-CM

## 2012-02-04 NOTE — Progress Notes (Signed)
Followup note:  [name] visits today approximately 1 month following completion of radiation therapy following conservative surgery in the management of her T2 N0 invasive ductal carcinoma the left breast. She is without complaints today, although she recently had a URI with ear infections. She is a Dr. Darnelle Catalan again this June after her mammography and MRI screening. She sees Dr. Ezzard Standing in [place]. She remains in good spirits.  Physical examination: Nodes: Without palpable cervical, supraclavicular, or axillary lymphadenopathy. Chest: Lungs clear. Breasts: There is residual mild erythema along left breast with no areas of desquamation. Cosmesis is excellent. There is slight thickening of the breast, no masses are appreciated. Right breast without masses or lesions. Abdomen without hepatomegaly. Extremities without edema.  Impression: Satisfactory progress.  Plan: She'll see Dr. Ezzard Standing this [place] Dr. Darnelle Catalan this June. She will have followup mammography and screening MRI this June. Because of her genetics, Dr. Darnelle Catalan is addressing her ovaries, and understand that she'll be seen in consultation through one of the gynecologic oncologists from [facility].

## 2012-02-04 NOTE — Progress Notes (Signed)
Here for routine follow one month post radiation of left breast.  On 3rd round anti-biotics for bilateral ear infections, flu and  Bronchitis. starteed  biaxin, will complete 2nd round of  augmentin on Sunday. Hyperpigmentation and rash cleared except for mildly pink skin. Fatigue started about 2 weeks after completion of treatment.Some difficulty sleeping at night.

## 2012-03-10 ENCOUNTER — Ambulatory Visit (INDEPENDENT_AMBULATORY_CARE_PROVIDER_SITE_OTHER): Payer: BC Managed Care – PPO | Admitting: Surgery

## 2012-03-10 VITALS — BP 120/82 | HR 72 | Temp 97.5°F | Resp 18 | Ht 62.0 in | Wt 175.0 lb

## 2012-03-10 DIAGNOSIS — C50919 Malignant neoplasm of unspecified site of unspecified female breast: Secondary | ICD-10-CM

## 2012-03-10 NOTE — Progress Notes (Signed)
[facility]  Ovidio Kin, MD,  FACS [address]  [address] [place], [place]    [address] Phone:  [phone] FAX:  [phone]   Re:   [name] DOB:   Jan 13, 1976 MRN:   [id number]  ASSESSMENT AND PLAN: 1. Left breast cancer, upper inner quadrant.   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   Left breast lumpectomy and left axillary SLNBx - 09/29/2011.  Finished neoadjuvant therapy by Dr. Darnelle Catalan. Dr. Dayton Scrape finished rad tx in February 2013.   Disease free.  I will see her in 6 months for follow up of her breast cancer.  2. Power port removed. 10/26/2011 3. BRCA 1 positive. Wants to try breast conservation.  4. 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 This has essentially resolved  5. Has started fund raising with another patient of mine, Terrilyn Saver [phone]). 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  Lorene Dy is raising money for [employer].  Elleigh is raising money to be given directly to the hospital.  HISTORY OF PRESENT ILLNESS: Chief Complaint  Patient presents with  . Breast Cancer Long Term Follow Up    Crystal Lane is a 36 y.o. (DOB: Feb 21, 1976)  white female who is a patient of JUDGE,ERIN, FNP, FNP and comes to me today for follow up of left breast lumpectomy.  She is doing well.  She is accompanied with her mother.  She has no complaints.  Almost everything is going well for her now.  She is getting no further treatment.  Path (side, TNM): left.  T2, N0. Surgery: Left breast lumpectomy, Left ax SLNBx   Date: 09/29/2011 Size of tumor: 2.2 cm  (post neoadjuvant tx) Nodes: 0/1 ER: 0% PR: 0% Ki67: 82%  HER2Neu: neg  Medical Oncologist: Magrinat  Radiation Oncologist: Dayton Scrape  PHYSICAL EXAM: BP 120/82  Pulse 72  Temp(Src) 97.5 F (36.4 C) (Temporal)  Resp 18  Ht 5\' 2"  (1.575 m)  Wt 175 lb (79.379 kg)  BMI 32.01 kg/m2  HEENT:  Pupils equal.  Dentition good. NECK:  Supple.  No thyroid mass. LYMPH NODES:  No cervical,  supraclavicular, or axillary adenopathy. BREASTS -  RIGHT:  No palpable mass or nodule.  No nipple discharge.  Post site scar looks good.   LEFT:  Incision in UIQ looks good.  She has some numbness under left axilla to left upper arm, but this is getting better.  She has no changes in the skin from the rad tx.  He left nipple is "flat". UPPER EXTREMITIES:  No evidence of lymphedema.  DATA REVIEWED: Reviewed notes in Epic.  Ovidio Kin, MD, FACS Office:  [phone]

## 2012-04-21 ENCOUNTER — Other Ambulatory Visit: Payer: Self-pay | Admitting: Physician Assistant

## 2012-04-21 ENCOUNTER — Other Ambulatory Visit: Payer: Self-pay | Admitting: *Deleted

## 2012-04-21 ENCOUNTER — Telehealth: Payer: Self-pay | Admitting: *Deleted

## 2012-04-21 NOTE — Telephone Encounter (Signed)
left voice message to inform the patient of the appointment with Dr. Tamela Oddi left the patient the phone number [phone] to call and get directions to the  office gave medical records fax number [phone] to sent information over

## 2012-04-25 ENCOUNTER — Other Ambulatory Visit: Payer: Self-pay | Admitting: *Deleted

## 2012-04-25 DIAGNOSIS — C50919 Malignant neoplasm of unspecified site of unspecified female breast: Secondary | ICD-10-CM

## 2012-04-26 ENCOUNTER — Ambulatory Visit
Admission: RE | Admit: 2012-04-26 | Discharge: 2012-04-26 | Disposition: A | Discharge status: Home / Self Care | Payer: BC Managed Care – PPO | Source: Ambulatory Visit | Attending: Oncology | Admitting: Oncology

## 2012-04-26 ENCOUNTER — Other Ambulatory Visit (HOSPITAL_BASED_OUTPATIENT_CLINIC_OR_DEPARTMENT_OTHER): Payer: BC Managed Care – PPO | Admitting: Lab

## 2012-04-26 DIAGNOSIS — C50919 Malignant neoplasm of unspecified site of unspecified female breast: Secondary | ICD-10-CM

## 2012-04-26 LAB — COMPREHENSIVE METABOLIC PANEL
ALT: 9 U/L (ref 0–35)
BUN: 14 mg/dL (ref 6–23)
CO2: 26 mEq/L (ref 19–32)
Calcium: 8.7 mg/dL (ref 8.4–10.5)
Chloride: 104 mEq/L (ref 96–112)
Creatinine, Ser: 0.85 mg/dL (ref 0.50–1.10)
Glucose, Bld: 97 mg/dL (ref 70–99)

## 2012-04-26 LAB — CBC WITH DIFFERENTIAL/PLATELET
Basophils Absolute: 0 10*3/uL (ref 0.0–0.1)
HCT: 40.7 % (ref 34.8–46.6)
HGB: 13.4 g/dL (ref 11.6–15.9)
MONO#: 0.4 10*3/uL (ref 0.1–0.9)
NEUT%: 82.2 % — ABNORMAL HIGH (ref 38.4–76.8)
Platelets: 251 10*3/uL (ref 145–400)
WBC: 8 10*3/uL (ref 3.9–10.3)
lymph#: 0.9 10*3/uL (ref 0.9–3.3)

## 2012-04-26 MED ORDER — GADOBENATE DIMEGLUMINE 529 MG/ML IV SOLN
15.0000 mL | Freq: Once | INTRAVENOUS | Status: AC | PRN
  Administered 2012-04-26: 15 mL via INTRAVENOUS

## 2012-05-03 ENCOUNTER — Ambulatory Visit (HOSPITAL_BASED_OUTPATIENT_CLINIC_OR_DEPARTMENT_OTHER): Payer: BC Managed Care – PPO | Admitting: Oncology

## 2012-05-03 ENCOUNTER — Telehealth: Payer: Self-pay | Admitting: *Deleted

## 2012-05-03 DIAGNOSIS — C50919 Malignant neoplasm of unspecified site of unspecified female breast: Secondary | ICD-10-CM

## 2012-05-03 NOTE — Telephone Encounter (Signed)
Made patient appointment 11-14-2012 lab only 11-22-2012 berry at 3:15pm printed out calendar and gave to the patient

## 2012-05-03 NOTE — Progress Notes (Signed)
ID: Jakaiya M Shepherd   DOB: 1976-04-05  MR#: [id number]  WUJ#:[id number]  HISTORY OF PRESENT ILLNESS: Nesiah found a mass in her left breast in January 2012.  She brought it to [employer] (Publishing rights manager, at [facility]) attention, but she says by the time she got to the clinic after a couple of weeks the mass had pretty much cleared.  It came back in Valene, however, and this time she was referred for mammography where a study 02/16/2011 showed (per Dr. Logan Bores' notes, I do not have the actual report) a 4.2-cm lobulated mass as well as an enlarged left axillary node.  By ultrasound, the mass was partly solid partly cystic with a thickened.  Ultrasound of the left axilla showed a 1.7-cm node with an eccentrically thickened cortex.     On 5/11 the patient underwent ultrasound-guided needle core biopsy of the left breast mass, following aspiration of 10 mL of straw-colored fluid.  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  The MIB-1 was 82%.  With this information, the patient was referred to [facility] and she was set up for breast MRI 5/16, showing heterogeneously dense breasts, but in particular in the upper inner quadrant of the left breast a complex multilobulated partly cystic mass with a thick enhancing wall measuring altogether 4.3 cm.  The thickened wall of the lesion had rapid enhancement and washout, and the posterior margin of the lesion was close to but did not quite abut the pectoralis.  There was what looks like an intramammary lymph node in the posterior lateral left breast at the level of the nipple and several normal-appearing left axillary nodes, but a 2-cm left axillary lymph node with an eccentrically thickened cortex was again noted.  The rest of the exam including the left internal mammary nodal area and the right  side were unremarkable.  Since that procedure, the patient had a fine-needle aspiration of the suspicious left axillary lymph node whichwas benign, and she also had genetic testing which shows her to be BRCA-1 positive. She was treated neoadjuvantly as detailed below followed by surgery and radiation treatments completed February 2013  INTERVAL HISTORY: Kaleeah returns today with her mother Andrey Campanile for followup of Berklee's breast cancer. Pacey just came back from a trip to the [place], where she almost.bitten by a blue yield while swimming. She is working 45-50 hours a week but still manages to do is 3 times a week and some weights as well. She is thinking of training her dog Bella (7 pounds, a "teacup Puganise") as a therapy dog. Amazingly the dog gets along with cat Dexter, 22 pounds.  REVIEW OF SYSTEMS: She continues to have regular periods. She has not yet met with Dr. Tamela Oddi to discuss what to do regarding her BRCA positivity. For now Abbigale once close followup. She is still hoping to get pregnant some day. She is having a little bit more sensation in the left armpit area, which she attributes to the exercise. Otherwise a detailed review of systems was entirely negative.  PAST MEDICAL HISTORY: Past Medical History  Diagnosis Date  . Complication of anesthesia     excessive talking while sedated  . Anxiety     due to dx. of breast CA  . Seasonal allergies     current non-prod. cough, congestion  . Breast cancer     left  PAST SURGICAL HISTORY: Past Surgical History  Procedure Date  . Appendectomy 09/02/2005  . Portacath placement 6/12  . Breast lumpectomy w/ needle localization 09/29/11    left /SLNBx  . Breast lumpectomy 09/29/2011    Procedure: BREAST LUMPECTOMY WITH EXCISION OF SENTINEL NODE;  Surgeon: Kandis Cocking, MD;  Location: [facility];  Service: General;  Laterality: Left;  Needle localization @ [facility] 7:30, SLNbx(nuc med  9:30)  . Port-a-cath  removal 10/26/2011    Procedure: REMOVAL PORT-A-CATH;  Surgeon: Kandis Cocking, MD;  Location:  SURGERY CENTER;  Service: General;  Laterality: Right;    FAMILY HISTORY Family History  Problem Relation Age of Onset  . Diabetes Father   . Hyperlipidemia Father   The patient's mother is alive with a history of CIS of the cervix, status post laser treatment. The patient's mother has 2 sisters.  Neither the patient's mother nor the patient's mother's sisters, nor anyone in the patient's mother's family to their knowledge has any history of breast or ovarian cancer.  The patient's father is alive.  His mother may or may not have had ovarian cancer remotely.  She now has Alzheimer disease, and there are no records.  The patient's father had 2 sisters; currently age 23 and 34, neither of them have had breast or ovarian cancer.  GYNECOLOGIC HISTORY:  Carlyne had menarche at age 67.   She is G0. Periods are regular.  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  She separated from her husband, Casimiro Needle, in January 2012 and is officially divorced as of March 2013.  The patient's mother, Andrey Campanile, teaches reading in elementary school. Etoile's brother Kenard Gower is a Patent attorney. The patient attends a [employer] locally.   ADVANCED DIRECTIVES:  HEALTH MAINTENANCE: History  Substance Use Topics  . Smoking status: Never Smoker   . Smokeless tobacco: Never Used  . Alcohol Use: 0.0 oz/week     rare     Colonoscopy:  PAP:  Bone density:  Lipid panel:  Allergies  Allergen Reactions  . Betadine (Povidone Iodine) Nausea And Vomiting    The smell causes N/V  . Compazine Other (See Comments)    Causes patient to "pass out"  . Gelatin (Gelatin)     Ultrasound gel causes pt to have a rash     No current outpatient prescriptions on file.    OBJECTIVE: Young white woman who appears well There were no vitals filed for  this visit.   There is no height or weight on file to calculate BMI.    ECOG FS: 0  Sclerae unicteric Oropharynx clear No peripheral adenopathy Lungs no rales or rhonchi Heart regular rate and rhythm Abd benign MSK no focal spinal tenderness, no peripheral edema Neuro: nonfocal Breasts: Right breast no suspicious findings; left breast status post lumpectomy and radiation.There is no evidence of recurrence  LAB RESULTS: Lab Results  Component Value Date   WBC 8.0 04/26/2012   NEUTROABS 6.6* 04/26/2012   HGB 13.4 04/26/2012   HCT 40.7 04/26/2012   MCV 89.0 04/26/2012   PLT 251 04/26/2012      Chemistry      Component Value Date/Time   NA 138 04/26/2012 1444   K 4.1 04/26/2012 1444   CL 104 04/26/2012 1444   CO2 26 04/26/2012 1444   BUN 14 04/26/2012 1444   CREATININE 0.85 04/26/2012 1444  Component Value Date/Time   CALCIUM 8.7 04/26/2012 1444   ALKPHOS 76 04/26/2012 1444   AST 11 04/26/2012 1444   ALT 9 04/26/2012 1444   BILITOT 0.3 04/26/2012 1444       Lab Results  Component Value Date   LABCA2 18 01/26/2012    No components found with this basename: [id number]    No results found for this basename: INR:1;PROTIME:1 in the last 168 hours  Urinalysis No results found for this basename: colorurine,  appearanceur,  labspec,  phurine,  glucoseu,  hgbur,  bilirubinur,  ketonesur,  proteinur,  urobilinogen,  nitrite,  leukocytesur   STUDIES: Mr Breast Bilateral W Wo Contrast  04/28/2012  *RADIOLOGY REPORT*  Clinical Data: History of left breast cancer with lumpectomy in 2012, B R C A positive  BILATERAL BREAST MRI WITH AND WITHOUT CONTRAST  Technique: Multiplanar, multisequence MR images of both breasts were obtained prior to and following the intravenous administration of 15ml of multihance.  Three dimensional images were evaluated at the independent DynaCad workstation.  Comparison:  04/26/2012 mammogram, 07/21/2011 MRI  Findings: Marked bilateral parenchymal enhancement.  Post  lumpectomy scar posterior superior left breast.  No evidence of adenopathy in either axilla or suspicious enhancement within the left breast.  1 cm axillary tail normal lymph node on the left is noted.  On the right, there is no evidence of mass or suspicious enhancement.  IMPRESSION: Anticipated appearance status post left lumpectomy.  No suspicious findings.  BI-RADS CATEGORY 2:  Benign finding(s).  RECOMMENDATION: Yearly annual mammography recommended.  Given personal history of breast cancer and known B R C A positive, consider supplementing screening mammography with screening MRI.  THREE-DIMENSIONAL MR IMAGE RENDERING ON INDEPENDENT WORKSTATION:  Three-dimensional MR images were rendered by post-processing of the original MR data on an independent workstation.  The three- dimensional MR images were interpreted, and findings were reported in the accompanying complete MRI report for this study.  Original Report Authenticated By: [id number]   Mm Digital Diagnostic Bilat  04/26/2012  ** RADIOLOGY REPORT **  Clinical Data:  36 year old female with history of left breast cancer, lumpectomy and treatment in 2012.  BRCA positive.  For annual bilateral mammograms.  DIGITAL DIAGNOSTIC BILATERAL MAMMOGRAM with CAD  Comparison: 07/27/2011 and 03/18/2011 mammograms.  Findings: MLO and CC views of both breasts and a spot tangential view of the lumpectomy site performed.  Scarring in the left breast is identified.  There is no evidence of mass, nonsurgical distortion, or suspicious calcifications. The breast tissue is heterogeneously dense bilaterally.  Mammographic images were processed with CAD.  IMPRESSION: No specific mammographic evidence of malignancy.  Left breast scarring.  These findings were discussed with the patient.  She was encouraged to begin/continue monthly self exams and to contact her primary physician if any changes noted.  BI-RADS CATEGORY 2:  Benign finding(s).  Recommend bilateral diagnostic mammograms in  1 year. Strongly consider annual/biennial screening breast MRI in this patient with very high risk.  The patient has an appointment for screening breast MRI on 04/26/2012.  Original Report Authenticated By: Rosendo Gros, M.D.   ASSESSMENT: 68 y.Aliene Altes woman, BRCA-1 positive, with a complex cystic lesion documented Oddie 2012 involving the upper inner quadrant of the left breast, measuring 4.3 cm by MRI, clinically T2 N0, biopsy showing a high-grade invasive ductal carcinoma which was triple negative with an elevated MIB-1,  (1) treated neoadjuvantly with 4 cycles of dose dense doxorubicin and cyclophosphamide followed by 3 of 12  planned doses of weekly paclitaxel, discontinued because of concerns regarding neuropathy,  (2) status post definitive left lumpectomy and sentinel lymph node sampling 09/29/2011 for a residual ypT1c ypN0 invasive ductal carcinoma, grade 3  (3) followed by radiation completed February of 2012   PLAN: Makynzee is doing terrific, with no evidence of disease activity. She is going to be meeting with gynecology I believe later this week to discuss a plan regarding her ovaries. These will eventually have to be removed, but for now Maleah is hoping to perhaps enter a interrelationship at some point and perhaps get pregnant eventually. Possibly ultrasounds obtained once or twice a year at the same point in her menstrual cycle may be adequate, but I generally leave this to the discretion of the gynecologist, since there is no accepted/standard of care for ovarian cancer screening.  Loribeth is going to see Ovidio Kin in October. Accordingly she will see [place] again in January. We will continue to see her on a every 3 month basis until she completes her first 2 years of followup. She knows to call for any problems that may develop before the next visit.  Alizee Maple C    05/03/2012

## 2012-09-01 ENCOUNTER — Ambulatory Visit (INDEPENDENT_AMBULATORY_CARE_PROVIDER_SITE_OTHER): Payer: BC Managed Care – PPO | Admitting: Surgery

## 2012-09-01 ENCOUNTER — Encounter (INDEPENDENT_AMBULATORY_CARE_PROVIDER_SITE_OTHER): Payer: Self-pay | Admitting: Surgery

## 2012-09-01 VITALS — BP 118/82 | HR 60 | Temp 97.4°F | Resp 18 | Ht 62.0 in | Wt 179.0 lb

## 2012-09-01 DIAGNOSIS — C50919 Malignant neoplasm of unspecified site of unspecified female breast: Secondary | ICD-10-CM

## 2012-09-01 NOTE — Progress Notes (Signed)
[facility]  Ovidio Kin, MD,  FACS [address]  [address] [place], [place]    [address] Phone:  [phone] FAX:  [phone]   Re:   Crystal Lane DOB:   May 01, 1976 MRN:   [id number]  ASSESSMENT AND PLAN: 1. Left breast cancer, upper inner quadrant.   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   Left breast lumpectomy and left axillary SLNBx - 09/29/2011.  Finished neoadjuvant therapy by Dr. Darnelle Catalan.   Dr. Dayton Scrape finished rad tx in February 2013.   Disease free.  Doing well.  I will see her in 6 months for follow up of her breast cancer.  2. Power port removed. 10/26/2011 3. BRCA 1 positive.   She is doing breast conservation.  4. 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   She still has some numbness in her hands, which is worse in the AM. 5. Worked on fund raising with another patient of mine, Terrilyn Saver [phone]). 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  HISTORY OF PRESENT ILLNESS: Chief Complaint  Patient presents with  . Breast Cancer Long Term Follow Up    Crystal Lane is a 36 y.o. (DOB: 04/24/1976)  white female who is a patient of JUDGE,ERIN, FNP and comes to me today for follow up of left breast lumpectomy.  She is doing well.  She is accompanied with her mother.  The government shut down ended, so she was hoping to be out off today, but she has a back log of work..  Path (side, TNM): left.  T2, N0. Surgery: Left breast lumpectomy, Left ax SLNBx   Date: 09/29/2011 Size of tumor: 2.2 cm  (post neoadjuvant tx) Nodes: 0/1 ER: 0% PR: 0% Ki67: 82%  HER2Neu: neg  Medical Oncologist: Magrinat  Radiation Oncologist: Dayton Scrape  PHYSICAL EXAM: BP 118/82  Pulse 60  Temp 97.4 F (36.3 C) (Oral)  Resp 18  Ht 5\' 2"  (1.575 m)  Wt 179 lb (81.194 kg)  BMI 32.74 kg/m2  HEENT:  Pupils equal.  Dentition good. NECK:  Supple.  No thyroid mass. LYMPH NODES:  No cervical, supraclavicular, or axillary adenopathy. BREASTS -  RIGHT:  No  palpable mass or nodule.  No nipple discharge.  Post site scar looks good.   LEFT:  Incision in UIQ looks good.  He skin looks good without pigmentation  He left nipple is "flat". UPPER EXTREMITIES:  No evidence of lymphedema.  DATA REVIEWED: MRI and mammogram - 04/28/2012 - negative  Ovidio Kin, MD, FACS Office:  [phone]

## 2012-10-24 ENCOUNTER — Telehealth: Payer: Self-pay | Admitting: *Deleted

## 2012-10-24 ENCOUNTER — Other Ambulatory Visit: Payer: Self-pay | Admitting: Physician Assistant

## 2012-10-24 NOTE — Telephone Encounter (Signed)
Patient confirmed over the phone the new date and time 11-23-2012 at 9:15am

## 2012-11-14 ENCOUNTER — Other Ambulatory Visit (HOSPITAL_BASED_OUTPATIENT_CLINIC_OR_DEPARTMENT_OTHER): Payer: BC Managed Care – PPO | Admitting: Lab

## 2012-11-14 DIAGNOSIS — C50919 Malignant neoplasm of unspecified site of unspecified female breast: Secondary | ICD-10-CM

## 2012-11-14 DIAGNOSIS — C50419 Malignant neoplasm of upper-outer quadrant of unspecified female breast: Secondary | ICD-10-CM

## 2012-11-14 LAB — CBC WITH DIFFERENTIAL/PLATELET
BASO%: 0.6 % (ref 0.0–2.0)
HCT: 43.2 % (ref 34.8–46.6)
HGB: 14.5 g/dL (ref 11.6–15.9)
MCHC: 33.5 g/dL (ref 31.5–36.0)
MONO#: 0.4 10*3/uL (ref 0.1–0.9)
NEUT#: 5.8 10*3/uL (ref 1.5–6.5)
NEUT%: 80.5 % — ABNORMAL HIGH (ref 38.4–76.8)
WBC: 7.2 10*3/uL (ref 3.9–10.3)
lymph#: 1 10*3/uL (ref 0.9–3.3)

## 2012-11-14 LAB — COMPREHENSIVE METABOLIC PANEL (CC13)
ALT: 8 U/L (ref 0–55)
CO2: 24 mEq/L (ref 22–29)
Calcium: 8.9 mg/dL (ref 8.4–10.4)
Chloride: 107 mEq/L (ref 98–107)
Creatinine: 0.8 mg/dL (ref 0.6–1.1)
Total Protein: 6.8 g/dL (ref 6.4–8.3)

## 2012-11-22 ENCOUNTER — Ambulatory Visit: Payer: BC Managed Care – PPO | Admitting: Physician Assistant

## 2012-11-23 ENCOUNTER — Ambulatory Visit (HOSPITAL_BASED_OUTPATIENT_CLINIC_OR_DEPARTMENT_OTHER): Payer: BC Managed Care – PPO | Admitting: Physician Assistant

## 2012-11-23 ENCOUNTER — Encounter: Payer: Self-pay | Admitting: Physician Assistant

## 2012-11-23 ENCOUNTER — Telehealth: Payer: Self-pay | Admitting: Oncology

## 2012-11-23 VITALS — BP 109/71 | HR 85 | Temp 97.8°F | Resp 20 | Ht 62.0 in | Wt 184.9 lb

## 2012-11-23 DIAGNOSIS — C50919 Malignant neoplasm of unspecified site of unspecified female breast: Secondary | ICD-10-CM

## 2012-11-23 DIAGNOSIS — Z0000 Encounter for general adult medical examination without abnormal findings: Secondary | ICD-10-CM

## 2012-11-23 DIAGNOSIS — Z853 Personal history of malignant neoplasm of breast: Secondary | ICD-10-CM

## 2012-11-23 NOTE — Telephone Encounter (Signed)
Pt given appt schedule for July and mammo appt for 6/16. Per pt she saw Dr. Ezzard Standing in October 2013 and is already on recall for Hadja 2014. Pt aware I will call with appt to see Dr. Senaida Ores.

## 2012-11-23 NOTE — Progress Notes (Signed)
ID: Crystal Lane   DOB: August 10, 1976  MR#: [id number]  MVH#:[id number]  HISTORY OF PRESENT ILLNESS: Crystal Lane found a mass in her left breast in January 2012.  She brought it to Crystal Lane (Publishing rights manager, at [facility]) attention, but she says by the time she got to the clinic after a couple of weeks the mass had pretty much cleared.  It came back in Crystal Lane, however, and this time she was referred for mammography where a study 02/16/2011 showed (per Dr. Logan Lane' notes, I do not have the actual report) a 4.2-cm lobulated mass as well as an enlarged left axillary node.  By ultrasound, the mass was partly solid partly cystic with a thickened.  Ultrasound of the left axilla showed a 1.7-cm node with an eccentrically thickened cortex.     On 5/11 the patient underwent ultrasound-guided needle core biopsy of the left breast mass, following aspiration of 10 mL of straw-colored fluid.  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  The MIB-1 was 82%.  With this information, the patient was referred to Crystal Lane and she was set up for breast MRI 5/16, showing heterogeneously dense breasts, but in particular in the upper inner quadrant of the left breast a complex multilobulated partly cystic mass with a thick enhancing wall measuring altogether 4.3 cm.  The thickened wall of the lesion had rapid enhancement and washout, and the posterior margin of the lesion was close to but did not quite abut the pectoralis.  There was what looks like an intramammary lymph node in the posterior lateral left breast at the level of the nipple and several normal-appearing left axillary nodes, but a 2-cm left axillary lymph node with an eccentrically thickened cortex was again noted.  The rest of the exam including the left internal mammary nodal area and the right  side were unremarkable.  Since that procedure, the patient had a fine-needle aspiration of the suspicious left axillary lymph node whichwas benign, and she also had genetic testing which shows her to be BRCA-1 positive. She was treated neoadjuvantly as detailed below followed by surgery and radiation treatments completed February 2013  INTERVAL HISTORY:  Crystal Lane returns today with her mother Crystal Lane for followup of Crystal Lane's breast cancer. Crystal Lane tells me it has been a great year. She is still working between 45 and 50 hours per week, but has found time for several trips in the past year, including 2 trips to the, and a trip to [place]. In fact she leaves this weekend for another trip to the [place] with some friends. She is still enjoying her "teacup Puganise", Crystal Lane.  Physically, Crystal Lane really has no new complaints. She was originally scheduled to meet with Dr. Tamela Lane after her appointment here with Dr. Darnelle Lane in June, but that appointment did not work out, and she still needs to be evaluated by gynecologist. She is having regular periods, every 26-28 days, with a "normal" flow.  REVIEW OF SYSTEMS: Crystal Lane has had no recent illnesses and denies fevers or chills. She denies any skin changes. She's had no signs of abnormal bleeding. Her appetite is good, with no nausea or change in bowel habits. She denies any cough or increased shortness of breath. No abnormal headaches or dizziness. She continues to have some peripheral neuropathy, especially in the upper extremities. This seems to be worse in the morning, and gradually improves throughout the  day. She's had no new or unusual myalgias, arthralgias, or bony pain, and no peripheral swelling.  A detailed review of systems is otherwise noncontributory.    PAST MEDICAL HISTORY: Past Medical History  Diagnosis Date  . Complication of anesthesia     excessive talking while sedated  . Anxiety     due to dx. of breast CA  . Seasonal allergies      current non-prod. cough, congestion  . Breast cancer     left    PAST SURGICAL HISTORY: Past Surgical History  Procedure Date  . Appendectomy 09/02/2005  . Portacath placement 6/12  . Breast lumpectomy w/ needle localization 09/29/11    left /SLNBx  . Breast lumpectomy 09/29/2011    Procedure: BREAST LUMPECTOMY WITH EXCISION OF SENTINEL NODE;  Surgeon: Kandis Cocking, MD;  Location: [facility];  Service: Lane;  Laterality: Left;  Needle localization @ [facility] 7:30, SLNbx(nuc med  9:30)  . Port-a-cath removal 10/26/2011    Procedure: REMOVAL PORT-A-CATH;  Surgeon: Kandis Cocking, MD;  Location: [facility];  Service: Lane;  Laterality: Right;    FAMILY HISTORY Family History  Problem Relation Age of Onset  . Diabetes Father   . Hyperlipidemia Father   The patient's mother is alive with a history of CIS of the cervix, status post laser treatment. The patient's mother has 2 sisters.  Neither the patient's mother nor the patient's mother's sisters, nor anyone in the patient's mother's family to their knowledge has any history of breast or ovarian cancer.  The patient's father is alive.  His mother may or may not have had ovarian cancer remotely.  She now has Alzheimer disease, and there are no records.  The patient's father had 2 sisters; currently age 85 and 8, neither of them have had breast or ovarian cancer.  GYNECOLOGIC HISTORY:  Crystal Lane had menarche at age 76.   She is G0. Periods are regular.  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  She separated from her husband, Crystal Needle, in January 2012 and is officially divorced as of March 2013.  The patient's mother, Crystal Lane, teaches reading in elementary school. Crystal Lane's brother Crystal Lane is a Patent attorney. The patient attends a [employer] locally.   ADVANCED DIRECTIVES:  HEALTH MAINTENANCE: History  Substance Use Topics  .  Smoking status: Never Smoker   . Smokeless tobacco: Never Used  . Alcohol Use: 0.0 oz/week     Comment: rare     Colonoscopy:  PAP:  Bone density:  Lipid panel:  Allergies  Allergen Reactions  . Betadine (Povidone Iodine) Nausea And Vomiting    The smell causes N/V  . Compazine Other (See Comments)    Causes patient to "pass out"  . Gelatin (Gelatin)     Ultrasound gel causes pt to have a rash     No current outpatient prescriptions on file.    OBJECTIVE: Young white woman who appears well Filed Vitals:   11/23/12 0954  BP: 109/71  Pulse: 85  Temp: 97.8 F (36.6 C)  Resp: 20     Body mass index is 33.82 kg/(m^2).    ECOG FS: 0 Filed Weights   11/23/12 0954  Weight: 184 lb 14.4 oz (83.87 kg)   Sclerae unicteric Oropharynx clear No peripheral adenopathy Lungs clear with no rales or rhonchi Heart regular rate and rhythm Abdomen soft, nontender, with positive bowel sounds MSK no  focal spinal tenderness, no peripheral edema Neuro: nonfocal, alert and oriented x3 Breasts: Right breast is unremarkable; left breast status post lumpectomy and radiation. There is no evidence of local recurrence axillae are benign bilaterally, no adenopathy palpated.   LAB RESULTS: Lab Results  Component Value Date   WBC 7.2 11/14/2012   NEUTROABS 5.8 11/14/2012   HGB 14.5 11/14/2012   HCT 43.2 11/14/2012   MCV 88.9 11/14/2012   PLT 253 11/14/2012      Chemistry      Component Value Date/Time   NA 139 11/14/2012 0957   NA 138 04/26/2012 1444   K 4.4 11/14/2012 0957   K 4.1 04/26/2012 1444   CL 107 11/14/2012 0957   CL 104 04/26/2012 1444   CO2 24 11/14/2012 0957   CO2 26 04/26/2012 1444   BUN 21.0 11/14/2012 0957   BUN 14 04/26/2012 1444   CREATININE 0.8 11/14/2012 0957   CREATININE 0.85 04/26/2012 1444      Component Value Date/Time   CALCIUM 8.9 11/14/2012 0957   CALCIUM 8.7 04/26/2012 1444   ALKPHOS 93 11/14/2012 0957   ALKPHOS 76 04/26/2012 1444   AST 11 11/14/2012 0957    AST 11 04/26/2012 1444   ALT 8 11/14/2012 0957   ALT 9 04/26/2012 1444   BILITOT 0.26 11/14/2012 0957   BILITOT 0.3 04/26/2012 1444       Lab Results  Component Value Date   LABCA2 18 01/26/2012     STUDIES:  Most recent breast MRI and mammogram was obtained in June of 2013, both of which were unremarkable.  ASSESSMENT: 37 y.o.  Crystal Lane woman, BRCA-1 positive, with a complex cystic lesion documented [facility] 2012 involving the upper inner quadrant of the left breast, measuring 4.3 cm by MRI, clinically T2 N0, biopsy showing a high-grade invasive ductal carcinoma which was triple negative with an elevated MIB-1,  (1) treated neoadjuvantly with 4 cycles of dose dense doxorubicin and cyclophosphamide followed by 3 of 12 planned doses of weekly paclitaxel, discontinued because of concerns regarding neuropathy,  (2) status post definitive left lumpectomy and sentinel lymph node sampling 09/29/2011 for a residual ypT1c ypN0 invasive ductal carcinoma, grade 3  (3) followed by radiation completed February of 2012   PLAN:  Crystal Lane continues to do terrific, with no evidence of disease activity. To a different gynecologist, the goal being to discuss a plan regarding her ovaries in light of her BRCA1 positive status.Marland Kitchen She would still like to have the opportunity to get pregnant at some point in the future, and would like to discuss this with her gynecologist.   Crystal Lane will see Dr. Ezzard Standing in [place] 2014, then will return to see [place] in July, essentially being evaluated every 3 months for the first 2 years of followup. She'll be due for her next annual mammogram in June, and that is being scheduled for her as well.  Crystal Lane voices understanding and agreement with this plan, and will call with any changes or problems.   Crystal Lane    11/23/2012

## 2012-12-01 ENCOUNTER — Telehealth: Payer: Self-pay | Admitting: Oncology

## 2012-12-01 NOTE — Telephone Encounter (Signed)
S/w felicia @ Dr. Olegario Messier [name]'s office today and per [facility] fax records for review (Attn: Bonita Quin). Per Sunny Schlein after review office [facility]) will call pt w/appt. S/w pt today and she has been given info for Dr. Senaida Ores (phone/location) and will call to follow up if she does not hear from them soon. Fax coversheet to HIM to send notes to Dr. Berenda Morale office ASAP - fax [phone] and phone [phone].

## 2012-12-01 NOTE — Telephone Encounter (Signed)
Faxed rec's to Dr. Huel Cote @ [phone]

## 2012-12-13 ENCOUNTER — Telehealth: Payer: Self-pay | Admitting: Oncology

## 2012-12-13 NOTE — Telephone Encounter (Signed)
Becky from Dr. Olegario Messier [name]'s office called and pt is scheduled for 2/18 @ 9:30am. S/w pt today she is aware and has appt d/t/location.

## 2013-02-14 ENCOUNTER — Telehealth (INDEPENDENT_AMBULATORY_CARE_PROVIDER_SITE_OTHER): Payer: Self-pay

## 2013-02-14 NOTE — Telephone Encounter (Signed)
Patient can not come in for LTF 02/15/13 with Dr. Ezzard Standing made next soonest appointment for 05/04/13@845am  She is to have her MMG done on 05/01/13 per patient

## 2013-05-01 ENCOUNTER — Ambulatory Visit
Admission: RE | Admit: 2013-05-01 | Discharge: 2013-05-01 | Disposition: A | Discharge status: Home / Self Care | Payer: BC Managed Care – PPO | Source: Ambulatory Visit | Attending: Physician Assistant | Admitting: Physician Assistant

## 2013-05-01 DIAGNOSIS — Z853 Personal history of malignant neoplasm of breast: Secondary | ICD-10-CM

## 2013-05-01 DIAGNOSIS — C50919 Malignant neoplasm of unspecified site of unspecified female breast: Secondary | ICD-10-CM

## 2013-05-04 ENCOUNTER — Encounter (INDEPENDENT_AMBULATORY_CARE_PROVIDER_SITE_OTHER): Payer: Self-pay | Admitting: Surgery

## 2013-05-04 ENCOUNTER — Ambulatory Visit (INDEPENDENT_AMBULATORY_CARE_PROVIDER_SITE_OTHER): Payer: BC Managed Care – PPO | Admitting: Surgery

## 2013-05-04 VITALS — BP 128/76 | HR 76 | Temp 97.3°F | Resp 14 | Ht 62.0 in | Wt 184.2 lb

## 2013-05-04 DIAGNOSIS — C50919 Malignant neoplasm of unspecified site of unspecified female breast: Secondary | ICD-10-CM

## 2013-05-04 DIAGNOSIS — C50912 Malignant neoplasm of unspecified site of left female breast: Secondary | ICD-10-CM

## 2013-05-04 NOTE — Progress Notes (Signed)
[facility]  Ovidio Kin, MD,  FACS [address] [place]  [address] [place], [place]    [address] Phone:  [phone] FAX:  [phone]   Re:   Crystal Lane DOB:   November 08, 1976 MRN:   [id number]  ASSESSMENT AND PLAN: 1. Left breast cancer, upper inner quadrant.   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   Left breast lumpectomy and left axillary SLNBx - 09/29/2011.  Finished neoadjuvant therapy by Dr. Darnelle Catalan.   Dr. Dayton Scrape finished rad tx in February 2013.   Disease free.  Doing well.  I will see her in 6 months for follow up of her breast cancer.  2. Power port removed. 10/26/2011 3. BRCA 1 positive.   She is doing breast conservation.  4. 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   She still has some numbness in her hands, which is worse in the AM. 5. Worked on fund raising with another patient of mine, Terrilyn Saver [phone]). 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 6.  Engaged to be married 07/13/2013.  Going to [place] for honeymoon.  HISTORY OF PRESENT ILLNESS: Chief Complaint  Patient presents with  . Routine Post Op    LTFU lt br ca    Crystal Lane is a 37 y.o. (DOB: 1976/03/06)  white female who is a patient of JUDGE,ERIN, FNP and comes to me today for follow up of left breast lumpectomy.  She is doing well.  She is accompanied with her mother. Her big news is that she is engaged to be married 07/13/2013.  She just had her mammograms at the first of this week.  They are okay.  She does self exams daily.  She has no concerns.  Path (side, TNM): left.  T2, N0. Surgery: Left breast lumpectomy, Left ax SLNBx   Date: 09/29/2011 Size of tumor: 2.2 cm  (post neoadjuvant tx) Nodes: 0/1 ER: 0% PR: 0% Ki67: 82%  HER2Neu: neg  Medical Oncologist: Magrinat  Radiation Oncologist: Dayton Scrape  Social History: Engaged to be married 07/13/2013 - her fiancee works at [employer], has back ground in Estate agent. Mother is with patient.  PHYSICAL  EXAM: BP 128/76  Pulse 76  Temp(Src) 97.3 F (36.3 C) (Temporal)  Resp 14  Ht 5\' 2"  (1.575 m)  Wt 184 lb 3.2 oz (83.553 kg)  BMI 33.68 kg/m2  HEENT:  Pupils equal.  Dentition good. NECK:  Supple.  No thyroid mass. LYMPH NODES:  No cervical, supraclavicular, or axillary adenopathy. BREASTS -  RIGHT:  No palpable mass or nodule.  No nipple discharge.  Post site scar looks good.   LEFT:  Incision in UIQ looks good.  He skin looks good.  He left nipple is "flat". UPPER EXTREMITIES:  No evidence of lymphedema.  DATA REVIEWED: Mammogram ([facility]) - 05/01/2013 - negative  Ovidio Kin, MD, FACS Office:  [phone]

## 2013-05-16 ENCOUNTER — Other Ambulatory Visit (HOSPITAL_BASED_OUTPATIENT_CLINIC_OR_DEPARTMENT_OTHER): Payer: BC Managed Care – PPO | Admitting: Lab

## 2013-05-16 DIAGNOSIS — C50919 Malignant neoplasm of unspecified site of unspecified female breast: Secondary | ICD-10-CM

## 2013-05-16 DIAGNOSIS — Z853 Personal history of malignant neoplasm of breast: Secondary | ICD-10-CM

## 2013-05-16 LAB — COMPREHENSIVE METABOLIC PANEL (CC13)
ALT: 10 U/L (ref 0–55)
CO2: 24 mEq/L (ref 22–29)
Calcium: 9 mg/dL (ref 8.4–10.4)
Chloride: 107 mEq/L (ref 98–109)
Potassium: 4.2 mEq/L (ref 3.5–5.1)
Sodium: 139 mEq/L (ref 136–145)
Total Protein: 7.4 g/dL (ref 6.4–8.3)

## 2013-05-16 LAB — CBC WITH DIFFERENTIAL/PLATELET
BASO%: 0.5 % (ref 0.0–2.0)
HCT: 40.8 % (ref 34.8–46.6)
MCHC: 34.1 g/dL (ref 31.5–36.0)
MONO#: 0.4 10*3/uL (ref 0.1–0.9)
RBC: 4.73 10*6/uL (ref 3.70–5.45)
RDW: 13.6 % (ref 11.2–14.5)
WBC: 8.6 10*3/uL (ref 3.9–10.3)
lymph#: 1.2 10*3/uL (ref 0.9–3.3)

## 2013-05-23 ENCOUNTER — Ambulatory Visit (HOSPITAL_BASED_OUTPATIENT_CLINIC_OR_DEPARTMENT_OTHER): Payer: BC Managed Care – PPO | Admitting: Oncology

## 2013-05-23 ENCOUNTER — Telehealth: Payer: Self-pay | Admitting: *Deleted

## 2013-05-23 VITALS — BP 118/76 | HR 75 | Temp 98.3°F | Resp 20 | Ht 62.0 in | Wt 183.0 lb

## 2013-05-23 DIAGNOSIS — C50912 Malignant neoplasm of unspecified site of left female breast: Secondary | ICD-10-CM

## 2013-05-23 DIAGNOSIS — C50919 Malignant neoplasm of unspecified site of unspecified female breast: Secondary | ICD-10-CM

## 2013-05-23 NOTE — Telephone Encounter (Signed)
appts made and printed...td 

## 2013-05-23 NOTE — Progress Notes (Signed)
ID: Taylorann M Shepherd   DOB: 1975-12-06  MR#: [id number]  GUY#:[id number]   PCP: Loleta Dicker, FNP GYN: Huel Cote SU: Ovidio Kin OTHER MD:  HISTORY OF PRESENT ILLNESS: Zaara found a mass in her left breast in January 2012.  She brought it to [employer] (Publishing rights manager, at [facility]) attention, but she says by the time she got to the clinic after a couple of weeks the mass had pretty much cleared.  It came back in [place], however, and this time she was referred for mammography where a study 02/16/2011 showed (per Dr. Logan Bores' notes, I do not have the actual report) a 4.2-cm lobulated mass as well as an enlarged left axillary node.  By ultrasound, the mass was partly solid partly cystic with a thickened.  Ultrasound of the left axilla showed a 1.7-cm node with an eccentrically thickened cortex.     On 5/11 the patient underwent ultrasound-guided needle core biopsy of the left breast mass, following aspiration of 10 mL of straw-colored fluid.  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  The MIB-1 was 82%.  With this information, the patient was referred to [facility] and she was set up for breast MRI 5/16, showing heterogeneously dense breasts, but in particular in the upper inner quadrant of the left breast a complex multilobulated partly cystic mass with a thick enhancing wall measuring altogether 4.3 cm.  The thickened wall of the lesion had rapid enhancement and washout, and the posterior margin of the lesion was close to but did not quite abut the pectoralis.  There was what looks like an intramammary lymph node in the posterior lateral left breast at the level of the nipple and several normal-appearing left axillary nodes, but a 2-cm left axillary lymph node with an eccentrically thickened cortex was again noted.  The  rest of the exam including the left internal mammary nodal area and the right side were unremarkable.  Since that procedure, the patient had a fine-needle aspiration of the suspicious left axillary lymph node whichwas benign, and she also had genetic testing which shows her to be BRCA-1 positive. She was treated neoadjuvantly as detailed below followed by surgery and radiation treatments completed February 2013  INTERVAL HISTORY:  Darrian returns today with her mother Andrey Campanile for followup of Darolyn's breast cancer. The interval history is significant for Trinitie planning to marry Milliana Reddoch, who she tells me is a former Electronics engineer man, trained as a Investment banker, operational, working for [employer] but also with a degree in Publishing copy. She is moving to [place] and her brother Kenard Gower is moving to her house.   REVIEW OF SYSTEMS: A review of systems today is entirely negative. She is having regular periods.  PAST MEDICAL HISTORY: Past Medical History  Diagnosis Date  . Complication of anesthesia     excessive talking while sedated  . Anxiety     due to dx. of breast CA  . Seasonal allergies     current non-prod. cough, congestion  . Breast cancer     left    PAST SURGICAL HISTORY: Past Surgical History  Procedure Laterality Date  . Appendectomy  09/02/2005  . Portacath placement  6/12  . Breast lumpectomy w/ needle localization  09/29/11    left /SLNBx  . Breast lumpectomy  09/29/2011    Procedure: BREAST LUMPECTOMY WITH EXCISION OF SENTINEL NODE;  Surgeon: Onalee Hua  Jena Gauss, MD;  Location: [facility];  Service: General;  Laterality: Left;  [facility]le [facility] 7:30, SLNbx(nuc med  9:30)  . Port-a-cath removal  10/26/2011    Procedure: REMOVAL PORT-A-CATH;  Surgeon: Kandis Cocking, MD;  Location: [facility];  Service: General;  Laterality: Right;    FAMILY HISTORY Family History  Problem Relation Age of Onset  . Diabetes Father   . Hyperlipidemia Father   The  patient's mother is alive with a history of CIS of the cervix, status post laser treatment. The patient's mother has 2 sisters.  Neither the patient's mother nor the patient's mother's sisters, nor anyone in the patient's mother's family to their knowledge has any history of breast or ovarian cancer.  The patient's father is alive.  His mother may or may not have had ovarian cancer remotely.  She now has Alzheimer disease, and there are no records.  The patient's father had 2 sisters; currently age 42 and 54, neither of them have had breast or ovarian cancer.  GYNECOLOGIC HISTORY:  Katoya had menarche at age 81.   She is G0. Periods are regular.  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  She separated from her husband, Casimiro Needle, in January 2012 and was officially divorced as of March 2013.  The patient's mother, Andrey Campanile, teaches reading in elementary school. Juliza's brother Kenard Gower is a Patent attorney. The patient attends a [employer] locally.   ADVANCED DIRECTIVES: In place  HEALTH MAINTENANCE: History  Substance Use Topics  . Smoking status: Never Smoker   . Smokeless tobacco: Never Used  . Alcohol Use: 0.0 oz/week     Comment: rare     Colonoscopy:  PAP:  Bone density:  Lipid panel:  Allergies  Allergen Reactions  . Betadine (Povidone Iodine) Nausea And Vomiting    The smell causes N/V  . Compazine Other (See Comments)    Causes patient to "pass out"  . Gelatin (Gelatin)     Ultrasound gel causes pt to have a rash     No current outpatient prescriptions on file.   No current facility-administered medications for this visit.    OBJECTIVE: Young white woman in no acute distress Filed Vitals:   05/23/13 1539  BP: 118/76  Pulse: 75  Temp: 98.3 F (36.8 C)  Resp: 20     Body mass index is 33.46 kg/(m^2).    ECOG FS: 0 Filed Weights   05/23/13 1539  Weight: 183 lb (83.008 kg)   Sclerae  unicteric Oropharynx clear No peripheral adenopathy Lungs clear with no rales or rhonchi Heart regular rate and rhythm Abdomen soft, nontender, with positive bowel sounds MSK no focal spinal tenderness, no peripheral edema Neuro: nonfocal, well oriented, exuberant affect Breasts: Right breast is unremarkable; left breast is status post lumpectomy and radiation. There is no evidence of local recurrence. The left axilla is benign   LAB RESULTS: Lab Results  Component Value Date   WBC 8.6 05/16/2013   NEUTROABS 6.8* 05/16/2013   HGB 13.9 05/16/2013   HCT 40.8 05/16/2013   MCV 86.2 05/16/2013   PLT 279 05/16/2013      Chemistry      Component Value Date/Time   NA 139 05/16/2013 0816   NA 138 04/26/2012 1444   K 4.2 05/16/2013 0816   K 4.1 04/26/2012 1444   CL 107 11/14/2012 0957   CL 104 04/26/2012  1444   CO2 24 05/16/2013 0816   CO2 26 04/26/2012 1444   BUN 15.8 05/16/2013 0816   BUN 14 04/26/2012 1444   CREATININE 0.8 05/16/2013 0816   CREATININE 0.85 04/26/2012 1444      Component Value Date/Time   CALCIUM 9.0 05/16/2013 0816   CALCIUM 8.7 04/26/2012 1444   ALKPHOS 81 05/16/2013 0816   ALKPHOS 76 04/26/2012 1444   AST 11 05/16/2013 0816   AST 11 04/26/2012 1444   ALT 10 05/16/2013 0816   ALT 9 04/26/2012 1444   BILITOT 0.26 05/16/2013 0816   BILITOT 0.3 04/26/2012 1444       Lab Results  Component Value Date   LABCA2 18 01/26/2012     STUDIES: Mm Digital Diagnostic Bilat  05/01/2013   *RADIOLOGY REPORT*  Clinical Data:  Personal history of left breast cancer status post lumpectomy 2012  DIGITAL DIAGNOSTIC BILATERAL MAMMOGRAM WITH CAD  Comparison: July 27, 2011, April 26, 2012  Findings:  ACR Breast Density Category heterogeneously dense.  CC and MLO views of bilateral breasts, spot tangential view of the left breast are submitted.  No suspicious abnormality is identified bilaterally.  Mammographic images were processed with CAD.  IMPRESSION: Benign findings.  RECOMMENDATION: Bilateral diagnostic  mammogram in 1 year.  I have discussed the findings and recommendations with the patient. Results were also provided in writing at the conclusion of the visit.  If applicable, a reminder letter will be sent to the patient regarding her next appointment.  BI-RADS CATEGORY 2:  Benign finding(s).   Original Report Authenticated By: Sherian Rein, M.D.    ASSESSMENT: 37 y.o.  Kathryne Sharper woman, BRCA-1 positive, with a complex cystic lesion documented [place] 2012 involving the upper inner quadrant of the left breast, measuring 4.3 cm by MRI, clinically T2 N0, biopsy showing a high-grade invasive ductal carcinoma which was triple negative with an elevated MIB-1,  (1) treated neoadjuvantly with 4 cycles of dose dense doxorubicin and cyclophosphamide followed by 3 of 12 planned doses of weekly paclitaxel, discontinued because of concerns regarding neuropathy,  (2) status post definitive left lumpectomy and sentinel lymph node sampling 09/29/2011 for a residual ypT1c ypN0 invasive ductal carcinoma, grade 3  (3) followed by radiation completed February of 2012   PLAN:  Jillienne is doing terrific from a breast cancer point of view and her upcoming wedding later this month is great news all around. She will see Dr. Ezzard Standing late October and she will see [place] again in January. She is also seeing Dr. Senaida Ores every 6 months for serial ultrasounds obtained at a predetermined point in [place]. To do the best we can for ovarian cancer screening. Angelea knows to call for any problems that may develop before the next visit here.   MAGRINAT,GUSTAV C    05/23/2013

## 2013-09-15 ENCOUNTER — Telehealth: Payer: Self-pay | Admitting: Oncology

## 2013-09-15 NOTE — Telephone Encounter (Signed)
, °

## 2013-09-21 ENCOUNTER — Other Ambulatory Visit: Payer: Self-pay

## 2013-10-20 ENCOUNTER — Ambulatory Visit (INDEPENDENT_AMBULATORY_CARE_PROVIDER_SITE_OTHER): Payer: BC Managed Care – PPO | Admitting: Surgery

## 2013-10-20 VITALS — BP 118/80 | HR 80 | Temp 97.8°F | Resp 16 | Ht 62.0 in | Wt 188.0 lb

## 2013-10-20 DIAGNOSIS — C50919 Malignant neoplasm of unspecified site of unspecified female breast: Secondary | ICD-10-CM

## 2013-10-20 DIAGNOSIS — C50912 Malignant neoplasm of unspecified site of left female breast: Secondary | ICD-10-CM

## 2013-10-20 NOTE — Progress Notes (Signed)
[facility]  Ovidio Kin, MD,  FACS [address] [place]  [address] [place], [place]    [address] Phone:  [phone] FAX:  [phone]   Re:   Crystal Lane DOB:   02/05/1976 MRN:   [id number]  ASSESSMENT AND PLAN: 1. Left breast cancer, upper inner quadrant.   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   Left breast lumpectomy and left axillary SLNBx - 09/29/2011.  Finished neoadjuvant therapy by Dr. Darnelle Catalan.   Dr. Dayton Scrape finished rad tx in February 2013.   Disease free.  Doing well.  I will see her in 6 months for follow up of her breast cancer.  2. Power port removed. 10/26/2011 3. BRCA 1 positive.   She is doing breast conservation.  4. 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   This seems to be slowly resolving. 5.  Married 07/13/2013.  Husband Arlys John is with her.  Went to [place] for honeymoon, but she was sick.  So [place].  She now has 3 step daughters, 21,23,25 and a 57 yo grandson (though she does not consider herself a grandmother).  HISTORY OF PRESENT ILLNESS: Chief Complaint  Patient presents with  . Breast Cancer Long Term Follow Up    Crystal Lane is a 37 y.o. (DOB: 25-Oct-1976)  white female who is a patient of JUDGE,ERIN, FNP and comes to me today for follow up of left breast lumpectomy.  Husband, Arlys John, is with patient. She is doing well.   She does self exams daily.  She has no concerns or questions.  Path (side, TNM): left.  T2, N0. Surgery: Left breast lumpectomy, Left ax SLNBx   Date: 09/29/2011 Size of tumor: 2.2 cm  (post neoadjuvant tx) Nodes: 0/1 ER: 0% PR: 0% Ki67: 82%  HER2Neu: neg  Medical Oncologist: Magrinat  Radiation Oncologist: Dayton Scrape  Social History: Husband, Arlys John. Engaged to be married 07/13/2013 - her fiancee works at [employer], has back ground in Estate agent. She worked on Metall[employer] with another patient of mine, Terrilyn Saver [phone]). 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  PHYSICAL EXAM: BP 118/80  Pulse 80  Temp(Src) 97.8 F (36.6 C) (Temporal)  Resp 16  Ht 5\' 2"  (1.575 m)  Wt 188 lb (85.276 kg)  BMI 34.38 kg/m2  HEENT:  Pupils equal.  Dentition good. NECK:  Supple.  No thyroid mass. LYMPH NODES:  No cervical, supraclavicular, or axillary adenopathy. BREASTS -  RIGHT:  No palpable mass or nodule.  No nipple discharge.  Post site scar looks good.   LEFT:  Incision in UIQ looks good.  He skin looks good.   UPPER EXTREMITIES:  No evidence of lymphedema.  DATA REVIEWED: Mammogram ([facility]) - 05/01/2013 - negative  Ovidio Kin, MD, FACS Office:  [phone]

## 2013-11-10 ENCOUNTER — Encounter: Payer: Self-pay | Admitting: Emergency Medicine

## 2013-11-10 ENCOUNTER — Emergency Department (INDEPENDENT_AMBULATORY_CARE_PROVIDER_SITE_OTHER)
Admission: EM | Admit: 2013-11-10 | Discharge: 2013-11-10 | Disposition: A | Discharge status: Home / Self Care | Payer: BC Managed Care – PPO | Source: Home / Self Care | Attending: Family Medicine | Admitting: Family Medicine

## 2013-11-10 DIAGNOSIS — J069 Acute upper respiratory infection, unspecified: Secondary | ICD-10-CM

## 2013-11-10 MED ORDER — GUAIFENESIN-CODEINE 100-10 MG/5ML PO SOLN: ORAL | Status: DC

## 2013-11-10 MED ORDER — AZITHROMYCIN 250 MG PO TABS: ORAL_TABLET | ORAL | Status: DC

## 2013-11-10 NOTE — ED Provider Notes (Signed)
CSN: [id number]     Arrival date & time 11/10/13  1516 History   First MD Initiated Contact with Patient 11/10/13 1543     Chief Complaint  Patient presents with  . Cough  . Wheezing  . Fatigue  . Generalized Body Aches      HPI Comments: Patient was treated for a strep pharyngitis about 3 weeks ago with two courses of Augmentin.  Her symptoms resolved, and 3 to 4 days ago she developed myalgias, headache, increased cough, and fatigue.  Her cough is worse at night.  The history is provided by the patient.    Past Medical History  Diagnosis Date  . Complication of anesthesia     excessive talking while sedated  . Anxiety     due to dx. of breast CA  . Seasonal allergies     current non-prod. cough, congestion  . Breast cancer     left   Past Surgical History  Procedure Laterality Date  . Appendectomy  09/02/2005  . Portacath placement  6/12  . Breast lumpectomy w/ needle localization  09/29/11    left /SLNBx  . Breast lumpectomy  09/29/2011    Procedure: BREAST LUMPECTOMY WITH EXCISION OF SENTINEL NODE;  Surgeon: Kandis Cocking, MD;  Location: [facility];  Service: General;  Laterality: Left;  Needle [facility] 7:30, SLNbx(nuc med  9:30)  . Port-a-cath removal  10/26/2011    Procedure: REMOVAL PORT-A-CATH;  Surgeon: Kandis Cocking, MD;  Location: [facility];  Service: General;  Laterality: Right;   Family History  Problem Relation Age of Onset  . Diabetes Father   . Hyperlipidemia Father    History  Substance Use Topics  . Smoking status: Never Smoker   . Smokeless tobacco: Never Used  . Alcohol Use: 0.0 oz/week     Comment: rare   OB History   Grav Para Term Preterm Abortions TAB SAB Ect Mult Living                 Review of Systems + sore throat + cough No pleuritic pain + wheezing + nasal congestion + post-nasal drainage No sinus pain/pressure No itchy/red eyes No earache No hemoptysis No SOB + fever,  + chills No nausea No vomiting No abdominal pain No diarrhea No urinary symptoms No skin rash + fatigue + myalgias + headache Used OTC meds without relief  Allergies  Betadine; Compazine; and Gelatin  Home Medications   Current Outpatient Rx  Name  Route  Sig  Dispense  Refill  . albuterol (PROVENTIL HFA;VENTOLIN HFA) 108 (90 BASE) MCG/ACT inhaler   Inhalation   Inhale into the lungs every 6 (six) hours as needed for wheezing or shortness of breath.         . guaiFENesin (MUCINEX) 600 MG 12 hr tablet   Oral   Take by mouth 2 (two) times daily.         Marland Kitchen azithromycin (ZITHROMAX Z-PAK) 250 MG tablet      Take 2 tabs today; then begin one tab once daily for 4 more days. (Rx void after 11/18/13)   6 each   0   . guaiFENesin-codeine 100-10 MG/5ML syrup      Take 10mL by mouth at bedtime as needed for cough   120 mL   0    BP 115/76  Pulse 104  Temp(Src) 100.1 F (37.8 C) (Oral)  Resp 18  Ht 5\' 2"  (1.575 m)  Wt 181 lb (82.101 kg)  BMI 33.10 kg/m2  SpO2 95%  LMP 11/03/2013 Physical Exam Nursing notes and Vital Signs reviewed. Appearance:  Patient appears stated age, and in no acute distress.  Patient is obese (BMI 33.1) Eyes:  Pupils are equal, round, and reactive to light and accomodation.  Extraocular movement is intact.  Conjunctivae are not inflamed  Ears:  Canals normal.  Tympanic membranes normal.  Nose:  Mildly congested turbinates.  No sinus tenderness.   Pharynx:  Normal Neck:  Supple.  Slightly tender shotty posterior nodes are palpated bilaterally  Lungs:  Clear to auscultation.  Breath sounds are equal.  Heart:  Regular rate and rhythm without murmurs, rubs, or gallops.  Abdomen:  Nontender without masses or hepatosplenomegaly.  Bowel sounds are present.  No CVA or flank tenderness.  Extremities:  No edema.  No calf tenderness Skin:  No rash present.   ED Course  Procedures  none       MDM   1. Acute upper respiratory infections of  unspecified site    There is no evidence of bacterial infection today.   Treat symptomatically for now  Rx for Robitussin AC for bedtime cough. Take plain Mucinex (1200 mg guaifenesin) twice daily for cough and congestion.  May add Sudafed for sinus congestion.   Increase fluid intake, rest. May use Afrin nasal spray (or generic oxymetazoline) twice daily for about 5 days.  Also recommend using saline nasal spray several times daily and saline nasal irrigation (AYR is a common brand) Stop all antihistamines for now, and other non-prescription cough/cold preparations. May take Ibuprofen 200mg , 4 tabs every 8 hours with food for body aches, fever, etc. Begin Azithromycin if not improving about 5 days or if persistent fever develops (Given a prescription to hold, with an expiration date)  Follow-up with family doctor if not improving 7 to 10 days.     Lattie Haw, MD 11/15/13 [phone]

## 2013-11-10 NOTE — ED Notes (Signed)
Reports 3 week history of being treated for positive Strep x 2 courses of Augmentin; main c/o tightness in chest when coughing. Has not had Flu vaccination this season.

## 2013-11-14 ENCOUNTER — Other Ambulatory Visit: Payer: BC Managed Care – PPO | Admitting: Lab

## 2013-11-21 ENCOUNTER — Ambulatory Visit: Payer: BC Managed Care – PPO | Admitting: Physician Assistant

## 2014-01-03 ENCOUNTER — Other Ambulatory Visit: Payer: Self-pay | Admitting: Physician Assistant

## 2014-01-03 DIAGNOSIS — C50919 Malignant neoplasm of unspecified site of unspecified female breast: Secondary | ICD-10-CM

## 2014-01-04 ENCOUNTER — Other Ambulatory Visit (HOSPITAL_BASED_OUTPATIENT_CLINIC_OR_DEPARTMENT_OTHER): Payer: BC Managed Care – PPO

## 2014-01-04 DIAGNOSIS — C50919 Malignant neoplasm of unspecified site of unspecified female breast: Secondary | ICD-10-CM

## 2014-01-04 LAB — COMPREHENSIVE METABOLIC PANEL (CC13)
ALT: 13 U/L (ref 0–55)
AST: 13 U/L (ref 5–34)
Albumin: 3.8 g/dL (ref 3.5–5.0)
Alkaline Phosphatase: 97 U/L (ref 40–150)
Anion Gap: 8 mEq/L (ref 3–11)
BUN: 18 mg/dL (ref 7.0–26.0)
CO2: 25 mEq/L (ref 22–29)
CREATININE: 0.7 mg/dL (ref 0.6–1.1)
Calcium: 9.2 mg/dL (ref 8.4–10.4)
Chloride: 106 mEq/L (ref 98–109)
Glucose: 99 mg/dl (ref 70–140)
Potassium: 4 mEq/L (ref 3.5–5.1)
SODIUM: 140 meq/L (ref 136–145)
Total Protein: 7.2 g/dL (ref 6.4–8.3)

## 2014-01-04 LAB — CBC WITH DIFFERENTIAL/PLATELET
BASO%: 0.7 % (ref 0.0–2.0)
Basophils Absolute: 0.1 10*3/uL (ref 0.0–0.1)
EOS%: 1 % (ref 0.0–7.0)
Eosinophils Absolute: 0.1 10*3/uL (ref 0.0–0.5)
HCT: 41.3 % (ref 34.8–46.6)
HGB: 13.7 g/dL (ref 11.6–15.9)
LYMPH#: 1.8 10*3/uL (ref 0.9–3.3)
LYMPH%: 16.5 % (ref 14.0–49.7)
MCH: 29.3 pg (ref 25.1–34.0)
MCHC: 33.2 g/dL (ref 31.5–36.0)
MCV: 88.3 fL (ref 79.5–101.0)
MONO#: 0.6 10*3/uL (ref 0.1–0.9)
MONO%: 5.5 % (ref 0.0–14.0)
NEUT#: 8.3 10*3/uL — ABNORMAL HIGH (ref 1.5–6.5)
NEUT%: 76.3 % (ref 38.4–76.8)
PLATELETS: 281 10*3/uL (ref 145–400)
RBC: 4.68 10*6/uL (ref 3.70–5.45)
RDW: 13.9 % (ref 11.2–14.5)
WBC: 10.8 10*3/uL — AB (ref 3.9–10.3)

## 2014-01-10 ENCOUNTER — Other Ambulatory Visit: Payer: Self-pay | Admitting: Physician Assistant

## 2014-01-10 ENCOUNTER — Telehealth: Payer: Self-pay | Admitting: *Deleted

## 2014-01-10 NOTE — Progress Notes (Signed)
I spent to [place] by phone today. Due to the inclement weather expected on 01/11/2014, she would like to move her followup appointment with me out to March 23. A POF was generated to schedule her appointment with me on March 23 at 2:15 PM. This was reviewed with the patient who voices her agreement.   Micah Flesher, PA-C 01/10/2014

## 2014-01-10 NOTE — Telephone Encounter (Signed)
Per pof .the patient is aware of this appt time...td

## 2014-01-11 ENCOUNTER — Ambulatory Visit: Payer: BC Managed Care – PPO | Admitting: Physician Assistant

## 2014-01-29 ENCOUNTER — Other Ambulatory Visit: Payer: Self-pay | Admitting: Physician Assistant

## 2014-02-01 ENCOUNTER — Telehealth: Payer: Self-pay | Admitting: *Deleted

## 2014-02-01 NOTE — Telephone Encounter (Signed)
Patient called wanting information regarding her BRCA testing done in 2012. Gave her Dr Hopkins information to contact, that was the ordering physician. 

## 2014-02-05 ENCOUNTER — Ambulatory Visit (HOSPITAL_BASED_OUTPATIENT_CLINIC_OR_DEPARTMENT_OTHER): Payer: BC Managed Care – PPO | Admitting: Physician Assistant

## 2014-02-05 ENCOUNTER — Encounter: Payer: Self-pay | Admitting: Physician Assistant

## 2014-02-05 VITALS — BP 125/79 | HR 86 | Temp 98.8°F | Resp 18 | Ht 62.0 in | Wt 190.5 lb

## 2014-02-05 DIAGNOSIS — Z853 Personal history of malignant neoplasm of breast: Secondary | ICD-10-CM

## 2014-02-05 DIAGNOSIS — Z1501 Genetic susceptibility to malignant neoplasm of breast: Secondary | ICD-10-CM

## 2014-02-05 DIAGNOSIS — R922 Inconclusive mammogram: Secondary | ICD-10-CM | POA: Insufficient documentation

## 2014-02-05 DIAGNOSIS — C50919 Malignant neoplasm of unspecified site of unspecified female breast: Secondary | ICD-10-CM

## 2014-02-05 NOTE — Progress Notes (Signed)
ID: Crystal Lane   DOB: Crystal Lane 19, 1977  MR#: [id number]  YVO#:[id number]   PCP: Crystal Cunas, FNP GYN: Crystal Compton, MD SU: Crystal Overall, MD OTHER MD: Crystal Koh, MD   CHIEF COMPLAINT:  Hx of Left Breast Cancer   HISTORY OF PRESENT ILLNESS: Crystal Lane found a mass in her left breast in January 2012.  She brought it to [employer] (Design[facility], jewellery, at [facility]) attention, but she says by the time she got to the clinic after a couple of weeks the mass had pretty much cleared.  It came back in [place], however, and this time she was referred for mammography where a study 02/16/2011 showed (per Dr. Amalia Hailey' notes, I do not have the actual report) a 4.2-cm lobulated mass as well as an enlarged left axillary node.  By ultrasound, the mass was partly solid partly cystic with a thickened.  Ultrasound of the left axilla showed a 1.7-cm node with an eccentrically thickened cortex.     On 5/11 the patient underwent ultrasound-guided needle core biopsy of the left breast mass, following aspiration of 10 mL of straw-colored fluid.  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  The MIB-1 was 82%.  With this information, the patient was referred to [facility] and she was set up for breast MRI 5/16, showing heterogeneously dense breasts, but in particular in the upper inner quadrant of the left breast a complex multilobulated partly cystic mass with a thick enhancing wall measuring altogether 4.3 cm.  The thickened wall of the lesion had rapid enhancement and washout, and the posterior margin of the lesion was close to but did not quite abut the pectoralis.  There was what looks like an intramammary lymph node in the posterior lateral left breast at the level of the nipple and several normal-appearing left axillary nodes, but a 2-cm left axillary  lymph node with an eccentrically thickened cortex was again noted.  The rest of the exam including the left internal mammary nodal area and the right side were unremarkable.  Since that procedure, the patient had a fine-needle aspiration of the suspicious left axillary lymph node whichwas benign, and she also had genetic testing which shows her to be BRCA-1 positive. She was treated neoadjuvantly as detailed below followed by surgery and radiation treatments completed February 2013.  Details of subsequent treatment history are as detailed below.   INTERVAL HISTORY:  Crystal Lane returns today accompanied by her husband Crystal Lane for followup of Crystal Lane's left breast cancer. She and  Crystal Lane married last July, and are both doing very well. [name] is still working lots of hours but this is "seasonal" and she hopes that things will slow down in the next few weeks.  Her mother, Crystal Lane, is getting ready to retire and Crystal Lane is in the process of planning a retirement party.  Physically, Crystal Lane is doing well with no new complaints. She is concerned about her weight but admits that she has not been exercising regularly. She had a bacterial illness in January and early February which was treated with 3 rounds of antibiotics, she tells me, and finally resolved. (This would certainly explain the slight increase in white cells noted on her CBC in mid February.)    REVIEW OF SYSTEMS: Crystal Lane denies any recent fevers, chills, night sweats, or hot flashes. She still having regular menstrual cycles, her last period being 01/31/2014. She denies any skin changes  or rashes and has had no abnormal bruising or bleeding. Her energy level is fair. Her appetite is good. She denies any nausea or emesis and has had no change in bowel or bladder habits. She also denies any cough, shortness of breath, phlegm production, chest pain, or palpitations. She's had no abnormal headaches, dizziness, or change in vision. She denies any unusual myalgias,  arthralgias, bony pain, or peripheral swelling.  In fact a detailed review of systems is otherwise entirely benign today.    PAST MEDICAL HISTORY: Past Medical History  Diagnosis Date  . Complication of anesthesia     excessive talking while sedated  . Anxiety     due to dx. of breast CA  . Seasonal allergies     current non-prod. cough, congestion  . Breast cancer     left    PAST SURGICAL HISTORY: Past Surgical History  Procedure Laterality Date  . Appendectomy  09/02/2005  . Portacath placement  6/12  . Breast lumpectomy w/ needle localization  09/29/11    left /SLNBx  . Breast lumpectomy  09/29/2011    Procedure: BREAST LUMPECTOMY WITH EXCISION OF SENTINEL NODE;  Surgeon: Shann Medal, MD;  Location: [facility];  Service: General;  Laterality: Left;  [facility]le [facility] 7:30, SLNbx(nuc med  9:30)  . Port-a-cath removal  10/26/2011    Procedure: REMOVAL PORT-A-CATH;  Surgeon: Shann Medal, MD;  Location: [facility];  Service: General;  Laterality: Right;    FAMILY HISTORY Family History  Problem Relation Age of Onset  . Diabetes Father   . Hyperlipidemia Father   The patient's mother is alive with a history of CIS of the cervix, status post laser treatment. The patient's mother has 2 sisters.  Neither the patient's mother nor the patient's mother's sisters, nor anyone in the patient's mother's family to their knowledge has any history of breast or ovarian cancer.  The patient's father is alive.  His mother may or may not have had ovarian cancer remotely.  She now has Alzheimer disease, and there are no records.  The patient's father had 2 sisters; currently age 50 and 82, neither of them have had breast or ovarian cancer.  GYNECOLOGIC HISTORY:  (Updated 02/05/2014)  Crystal Lane had menarche at age 14.   She is G0. Periods are regular, LMP on 01/31/2014.  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 She divorced her first husband, Crystal Lane, in March 2013. She married Crystal Duplechain in July 2014. The patient's mother, Crystal Lane, teaches reading in elementary school. Lewanda's brother [name] is a Scientist, forensic. The patient attends a [employer] locally.   ADVANCED DIRECTIVES: In place  HEALTH MAINTENANCE:  (Updated 02/05/2014) History  Substance Use Topics  . Smoking status: Never Smoker   . Smokeless tobacco: Never Used  . Alcohol Use: 0.0 oz/week     Comment: rare     Colonoscopy: Never  PAP: UTD/[name]  Bone density: Never  Lipid panel: Not on file    Allergies  Allergen Reactions  . Betadine [Povidone Iodine] Nausea And Vomiting    The smell causes N/V  . Compazine Other (See Comments)    Causes patient to "pass out"  . Gelatin [Gelatin]     Ultrasound gel causes pt to have a rash     Current Outpatient Prescriptions  Medication Sig Dispense Refill  . amoxicillin-clavulanate (AUGMENTIN) 875-125 MG per  tablet       . azithromycin (ZITHROMAX) 250 MG tablet       . GUAIFENESIN AC 100-10 MG/5ML syrup        No current facility-administered medications for this visit.    OBJECTIVE: Young white woman who appears well and is in no acute distress Filed Vitals:   02/05/14 1451  BP: 125/79  Pulse: 86  Temp: 98.8 F (37.1 C)  Resp: 18     Body mass index is 34.83 kg/(m^2).    ECOG FS: 0 Filed Weights   02/05/14 1451  Weight: 190 lb 8 oz (86.41 kg)   Physical Exam: HEENT:  Sclerae anicteric.  Oropharynx clear, pink, and moist. Neck supple. Trachea midline. No thyromegaly.  NODES:  No cervical or supraclavicular lymphadenopathy palpated.  BREAST EXAM:  Breast tissue is dense bilaterally. Right breast is otherwise unremarkable. Left breast is status post lumpectomy and radiation with no suspicious nodularities or skin changes. No evidence of local recurrence. Axillae are benign bilaterally, with no palpable  lymphadenopathy. LUNGS:  Clear to auscultation bilaterally with good excursion.  No wheezes or rhonchi HEART:  Regular rate and rhythm. No murmur  ABDOMEN:  Soft, nontender. No organomegaly palpated. Full range of motion bilaterally in the upper extremities.  Positive bowel sounds.  MSK:  No focal spinal tenderness to palpation.  EXTREMITIES:  No peripheral edema.  No lymphedema in the left upper extremity. SKIN:  Benign with no visible rashes or skin lesions. No ecchymoses or petechiae. No pallor. NEURO:  Nonfocal. Well oriented.  Positive affect.   LAB RESULTS: Lab Results  Component Value Date   WBC 10.8* 01/04/2014   NEUTROABS 8.3* 01/04/2014   HGB 13.7 01/04/2014   HCT 41.3 01/04/2014   MCV 88.3 01/04/2014   PLT 281 01/04/2014      Chemistry      Component Value Date/Time   NA 140 01/04/2014 1612   NA 138 04/26/2012 1444   K 4.0 01/04/2014 1612   K 4.1 04/26/2012 1444   CL 107 11/14/2012 0957   CL 104 04/26/2012 1444   CO2 25 01/04/2014 1612   CO2 26 04/26/2012 1444   BUN 18.0 01/04/2014 1612   BUN 14 04/26/2012 1444   CREATININE 0.7 01/04/2014 1612   CREATININE 0.85 04/26/2012 1444      Component Value Date/Time   CALCIUM 9.2 01/04/2014 1612   CALCIUM 8.7 04/26/2012 1444   ALKPHOS 97 01/04/2014 1612   ALKPHOS 76 04/26/2012 1444   AST 13 01/04/2014 1612   AST 11 04/26/2012 1444   ALT 13 01/04/2014 1612   ALT 9 04/26/2012 1444   BILITOT <0.20 01/04/2014 1612   BILITOT 0.3 04/26/2012 1444        STUDIES: Mm Digital Diagnostic Bilat 05/01/2013   *RADIOLOGY REPORT*  Clinical Data:  Personal history of left breast cancer status post lumpectomy 2012  DIGITAL DIAGNOSTIC BILATERAL MAMMOGRAM WITH CAD  Comparison: July 27, 2011, April 26, 2012  Findings:  ACR Breast Density Category heterogeneously dense.  CC and MLO views of bilateral breasts, spot tangential view of the left breast are submitted.  No suspicious abnormality is identified bilaterally.  Mammographic images were processed  with CAD.  IMPRESSION: Benign findings.  RECOMMENDATION: Bilateral diagnostic mammogram in 1 year.  I have discussed the findings and recommendations with the patient. Results were also provided in writing at the conclusion of the visit.  If applicable, a reminder letter will be sent to the patient regarding her next appointment.  BI-RADS CATEGORY 2:  Benign finding(s).   Original Report Authenticated By: Abelardo Diesel, M.Lane.   Most recent breast MRI was 04/28/2012 with no suspicious findings.   ASSESSMENT: 38 y.o.  [place] Ser woman,   (1)  BRCA-1 positive,   (2)  with a complex cystic lesion documented Janika 2012 involving the upper inner quadrant of the left breast, measuring 4.3 cm by MRI, clinically T2 N0, biopsy showing a high-grade invasive ductal carcinoma which was triple negative with an elevated MIB-1,  (3) treated neoadjuvantly with 4 cycles of dose dense doxorubicin and cyclophosphamide followed by 3 of 12 planned doses of weekly paclitaxel, discontinued because of concerns regarding neuropathy,  (4) status post definitive left lumpectomy and sentinel lymph node sampling 09/29/2011 for a residual ypT1c ypN0 invasive ductal carcinoma, grade 3  (5) followed by radiation completed February of 2012     PLAN:  Aalliyah  seems to be doing very well with regards to her breast cancer, and there is no clinical evidence of disease recurrence at this time. She'll be due for her next bilateral mammogram in June, and will be due as well for a breast MRI which we are repeating every 2 years. Both of those are being requested, and she'll return to see [place] 6 months from now, September 2015, for repeat labs and physical exam.  In the meanwhile, she'll also continue to see Dr. Lucia Gaskins and Dr. Marvel Plan for routine followup visits. Of course she knows to call us with any changes or problems prior to her next scheduled appointment.    Linnae Rasool PA-C    02/05/2014

## 2014-02-06 ENCOUNTER — Telehealth: Payer: Self-pay | Admitting: *Deleted

## 2014-02-06 NOTE — Telephone Encounter (Signed)
Per 3/23 POF I have scheduled appts. Patient needs scans also, advised schedulers.

## 2014-02-09 ENCOUNTER — Telehealth: Payer: Self-pay | Admitting: *Deleted

## 2014-02-09 NOTE — Telephone Encounter (Signed)
Per message left by pt regarding she is unable to schedule MRI of breast due to request per [facility] to schedule days 7-14 after menstrual cycle. Crystal Lane states her menstrual cycles are not regular enough to schedule per above for June of 2015.  This RN called [facility] and spoke with Angelita Ingles - discussed above- Angelita Ingles will call pt to schedule scan.

## 2014-04-26 ENCOUNTER — Other Ambulatory Visit: Payer: Self-pay | Admitting: Physician Assistant

## 2014-04-26 ENCOUNTER — Ambulatory Visit
Admission: RE | Admit: 2014-04-26 | Discharge: 2014-04-26 | Disposition: A | Discharge status: Home / Self Care | Payer: BC Managed Care – PPO | Source: Ambulatory Visit | Attending: Physician Assistant | Admitting: Physician Assistant

## 2014-04-26 DIAGNOSIS — Z853 Personal history of malignant neoplasm of breast: Secondary | ICD-10-CM

## 2014-04-26 DIAGNOSIS — R922 Inconclusive mammogram: Secondary | ICD-10-CM

## 2014-04-26 MED ORDER — GADOBENATE DIMEGLUMINE 529 MG/ML IV SOLN
17.0000 mL | Freq: Once | INTRAVENOUS | Status: AC | PRN
  Administered 2014-04-26: 17 mL via INTRAVENOUS

## 2014-05-02 ENCOUNTER — Other Ambulatory Visit: Payer: BC Managed Care – PPO

## 2014-07-03 ENCOUNTER — Telehealth: Payer: Self-pay | Admitting: Oncology

## 2014-07-03 NOTE — Telephone Encounter (Signed)
per pt to r/s appt-r/s pt aware of time & date

## 2014-07-10 ENCOUNTER — Other Ambulatory Visit: Payer: BC Managed Care – PPO

## 2014-07-12 ENCOUNTER — Ambulatory Visit (INDEPENDENT_AMBULATORY_CARE_PROVIDER_SITE_OTHER): Payer: BC Managed Care – PPO | Admitting: Surgery

## 2014-07-17 ENCOUNTER — Ambulatory Visit: Payer: BC Managed Care – PPO | Admitting: Oncology

## 2014-08-01 ENCOUNTER — Ambulatory Visit (INDEPENDENT_AMBULATORY_CARE_PROVIDER_SITE_OTHER): Payer: BC Managed Care – PPO | Admitting: Surgery

## 2014-10-10 ENCOUNTER — Other Ambulatory Visit: Payer: Self-pay | Admitting: *Deleted

## 2014-10-10 DIAGNOSIS — C50919 Malignant neoplasm of unspecified site of unspecified female breast: Secondary | ICD-10-CM

## 2014-10-12 ENCOUNTER — Other Ambulatory Visit (HOSPITAL_BASED_OUTPATIENT_CLINIC_OR_DEPARTMENT_OTHER): Payer: BC Managed Care – PPO

## 2014-10-12 DIAGNOSIS — C50919 Malignant neoplasm of unspecified site of unspecified female breast: Secondary | ICD-10-CM

## 2014-10-12 DIAGNOSIS — Z853 Personal history of malignant neoplasm of breast: Secondary | ICD-10-CM

## 2014-10-12 LAB — COMPREHENSIVE METABOLIC PANEL (CC13)
ALT: 9 U/L (ref 0–55)
AST: 13 U/L (ref 5–34)
Albumin: 3.6 g/dL (ref 3.5–5.0)
Alkaline Phosphatase: 76 U/L (ref 40–150)
Anion Gap: 9 mEq/L (ref 3–11)
BILIRUBIN TOTAL: 0.38 mg/dL (ref 0.20–1.20)
BUN: 13.7 mg/dL (ref 7.0–26.0)
CALCIUM: 9 mg/dL (ref 8.4–10.4)
CO2: 26 mEq/L (ref 22–29)
CREATININE: 0.8 mg/dL (ref 0.6–1.1)
Chloride: 105 mEq/L (ref 98–109)
Glucose: 109 mg/dl (ref 70–140)
Potassium: 4.1 mEq/L (ref 3.5–5.1)
Sodium: 140 mEq/L (ref 136–145)
Total Protein: 6.9 g/dL (ref 6.4–8.3)

## 2014-10-12 LAB — CBC WITH DIFFERENTIAL/PLATELET
BASO%: 0.7 % (ref 0.0–2.0)
BASOS ABS: 0.1 10*3/uL (ref 0.0–0.1)
EOS%: 1 % (ref 0.0–7.0)
Eosinophils Absolute: 0.1 10*3/uL (ref 0.0–0.5)
HEMATOCRIT: 42.6 % (ref 34.8–46.6)
HEMOGLOBIN: 13.5 g/dL (ref 11.6–15.9)
LYMPH%: 21.4 % (ref 14.0–49.7)
MCH: 28.2 pg (ref 25.1–34.0)
MCHC: 31.8 g/dL (ref 31.5–36.0)
MCV: 88.6 fL (ref 79.5–101.0)
MONO#: 0.4 10*3/uL (ref 0.1–0.9)
MONO%: 5.1 % (ref 0.0–14.0)
NEUT#: 5.4 10*3/uL (ref 1.5–6.5)
NEUT%: 71.8 % (ref 38.4–76.8)
PLATELETS: 282 10*3/uL (ref 145–400)
RBC: 4.8 10*6/uL (ref 3.70–5.45)
RDW: 14.2 % (ref 11.2–14.5)
WBC: 7.5 10*3/uL (ref 3.9–10.3)
lymph#: 1.6 10*3/uL (ref 0.9–3.3)

## 2014-10-18 ENCOUNTER — Ambulatory Visit (HOSPITAL_BASED_OUTPATIENT_CLINIC_OR_DEPARTMENT_OTHER): Payer: BC Managed Care – PPO | Admitting: Oncology

## 2014-10-18 ENCOUNTER — Telehealth: Payer: Self-pay | Admitting: Oncology

## 2014-10-18 VITALS — BP 123/75 | HR 74 | Temp 98.6°F | Resp 18 | Ht 62.0 in | Wt 187.2 lb

## 2014-10-18 DIAGNOSIS — Z853 Personal history of malignant neoplasm of breast: Secondary | ICD-10-CM

## 2014-10-18 DIAGNOSIS — C50912 Malignant neoplasm of unspecified site of left female breast: Secondary | ICD-10-CM

## 2014-10-18 NOTE — Telephone Encounter (Signed)
per pof to sch pt appt-gave pt copy of sch °

## 2014-10-18 NOTE — Progress Notes (Signed)
ID: Crystal Lane   DOB: Oct 09, 1976  MR#: [id number]  NWG#:[id number]   PCP: Crystal Cunas, FNP GYN: Crystal Compton, MD SU: Crystal Overall, MD OTHER MD: Crystal Koh, MD   CHIEF COMPLAINT:  BRCA1 positive Breast Cancer  CURRENT TREATMENT: Observation   HISTORY OF PRESENT ILLNESS: From the original intake note:  Crystal Lane found a mass in her left breast in January 2012.  She brought it to [facility] ([facility], at [place]) attention, but she says by the time she got to the clinic after a couple of weeks the mass had pretty much cleared.  It came back in [place], however, and this time she was referred for mammography where a study 02/16/2011 showed (per Dr. Amalia Lane' notes, I do not have the actual report) a 4.2-cm lobulated mass as well as an enlarged left axillary node.  By ultrasound, the mass was partly solid partly cystic with a thickened.  Ultrasound of the left axilla showed a 1.7-cm node with an eccentrically thickened cortex.     On 5/11 the patient underwent ultrasound-guided needle core biopsy of the left breast mass, following aspiration of 10 mL of straw-colored fluid.  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  The MIB-1 was 82%.  With this information, the patient was referred [facility] and she was set up for breast MRI 5/16, showing heterogeneously dense breasts, but in particular in the upper inner quadrant of the left breast a complex multilobulated partly cystic mass with a thick enhancing wall measuring altogether 4.3 cm.  The thickened wall of the lesion had rapid enhancement and washout, and the posterior margin of the lesion was close to but did not quite abut the pectoralis.  There was what looks like an intramammary lymph node in the posterior lateral left breast at the level of the nipple and  several normal-appearing left axillary nodes, but a 2-cm left axillary lymph node with an eccentrically thickened cortex was again noted.  The rest of the exam including the left internal mammary nodal area and the right side were unremarkable.  Since that procedure, the patient had a fine-needle aspiration of the suspicious left axillary lymph node whichwas benign, and she also had genetic testing which shows her to be BRCA-1 positive. She was treated neoadjuvantly as detailed below followed by surgery and radiation treatments completed February 2013.  Her subsequent history is as detailed below   INTERVAL HISTORY:  Crystal Lane returns today accompanied by her mother for followup of Crystal Lane's BRCA1 positive breast cancer. The interval history is generally unremarkable. 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 She and her husband Crystal Lane are doing some traveling, mostly in the [place]. Crystal was recently promoted to corporate level with the company Crystal works with.  REVIEW OF SYSTEMS: Crystal Lane just joined a fitness group and has very ambitious plans regarding fitness and weight. Her periods are now every 21 days, lasting approximately 4 days. She gets vaginal ultrasonography every 6 months through [facility],  always towards the end of Crystal Lane's period. Aside from these concerns a detailed review of systems today was entirely negative  PAST MEDICAL HISTORY: Past Medical History  Diagnosis Date  . Complication of anesthesia     excessive talking while sedated  .  Anxiety     due to dx. of breast CA  . Seasonal allergies     current non-prod. cough, congestion  . Breast cancer     left    PAST SURGICAL HISTORY: Past Surgical History  Procedure Laterality Date  . Appendectomy  09/02/2005  . Portacath placement  6/12  . Breast lumpectomy w/ needle localization   09/29/11    left /SLNBx  . Breast lumpectomy  09/29/2011    Procedure: BREAST LUMPECTOMY WITH EXCISION OF SENTINEL NODE;  Surgeon: Crystal Medal, MD;  Location: [facility];  Service: General;  Laterality: Left;  [facility] 7[facility], SLNbx(nuc med  9:30)  . Port-a-cath removal  10/26/2011    Procedure: REMOVAL PORT-A-CATH;  Surgeon: Crystal Medal, MD;  Location: [facility];  Service: General;  Laterality: Right;    FAMILY HISTORY Family History  Problem Relation Age of Onset  . Diabetes Father   . Hyperlipidemia Father   The patient's mother is alive with a history of CIS of the cervix, status post laser treatment. The patient's mother has 2 sisters.  Neither the patient's mother nor the patient's mother's sisters, nor anyone in the patient's mother's family to their knowledge has any history of breast or ovarian cancer.  The patient's father is alive.  His mother may or may not have had ovarian cancer remotely.  She now has Alzheimer disease, and there are no records.  The patient's father had 2 sisters; currently age 9 and 62, neither of them have had breast or ovarian cancer.  GYNECOLOGIC HISTORY:  (Updated 02/05/2014)  Crystal Lane had menarche at age 46.   She is G0. Periods are regular, LMP on 01/31/2014.  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 She divorced her first husband, Crystal Lane, in March 2013. She married Crystal Lane in July 2014. [place] so Crystal travels during the week and his home one week) which is better than it was before). The patient's mother, Crystal Lane, teaches reading in elementary school. [place] Crystal Lane is a Scientist, forensic. Crystal and his actress wife are expecting baby [place] any day now. The patient attends a [employer] locally.   ADVANCED DIRECTIVES: In place  HEALTH  MAINTENANCE:  (Updated 02/05/2014) History  Substance Use Topics  . Smoking status: Never Smoker   . Smokeless tobacco: Never Used  . Alcohol Use: 0.0 oz/week     Comment: rare     Colonoscopy:   PAP: UTD/[name]  Bone density:   Lipid panel:    Allergies  Allergen Reactions  . Betadine [Povidone Iodine] Nausea And Vomiting    The smell causes N/V  . Compazine Other (See Comments)    Causes patient to "pass out"  . Gelatin Rash    Ultrasound gel causes pt to have a rash     Current Outpatient Prescriptions  Medication Sig Dispense Refill  . amoxicillin-clavulanate (AUGMENTIN) 875-125 MG per tablet     . azithromycin (ZITHROMAX) 250 MG tablet     . GUAIFENESIN AC 100-10 MG/5ML syrup      No current facility-administered medications for this visit.    OBJECTIVE: Young white woman who appears well  Filed Vitals:   10/18/14 1516  BP: 123/75  Pulse: 74  Temp: 98.6 F (37 C)  Resp: 18     Body mass index is  34.23 kg/(m^2).    ECOG FS: 0 Filed Weights   10/18/14 1516  Weight: 187 lb 3.2 oz (84.913 kg)   Sclerae unicteric, pupils equal and reactive Oropharynx clear and moist-- no thrush No cervical or supraclavicular adenopathy Lungs no rales or rhonchi Heart regular rate and rhythm Abd soft, nontender, positive bowel sounds MSK no focal spinal tenderness, no upper extremity lymphedema Neuro: nonfocal, well oriented, appropriate affect Breasts: The right breast is unremarkable. The right axilla is benign. The left breast is status post lumpectomy and radiation. There is no evidence of local recurrence. Left axilla is benign.    LAB RESULTS: Lab Results  Component Value Date   WBC 7.5 10/12/2014   NEUTROABS 5.4 10/12/2014   HGB 13.5 10/12/2014   HCT 42.6 10/12/2014   MCV 88.6 10/12/2014   PLT 282 10/12/2014      Chemistry      Component Value Date/Time   NA 140 10/12/2014 1058   NA 138 04/26/2012 1444   K 4.1 10/12/2014 1058   K 4.1 04/26/2012  1444   CL 107 11/14/2012 0957   CL 104 04/26/2012 1444   CO2 26 10/12/2014 1058   CO2 26 04/26/2012 1444   BUN 13.7 10/12/2014 1058   BUN 14 04/26/2012 1444   CREATININE 0.8 10/12/2014 1058   CREATININE 0.85 04/26/2012 1444      Component Value Date/Time   CALCIUM 9.0 10/12/2014 1058   CALCIUM 8.7 04/26/2012 1444   ALKPHOS 76 10/12/2014 1058   ALKPHOS 76 04/26/2012 1444   AST 13 10/12/2014 1058   AST 11 04/26/2012 1444   ALT 9 10/12/2014 1058   ALT 9 04/26/2012 1444   BILITOT 0.38 10/12/2014 1058   BILITOT 0.3 04/26/2012 1444        STUDIES: CLINICAL DATA: Status post left lumpectomy, radiation therapy and chemotherapy for breast cancer in 2012.  LABS: None obtained today.  EXAM: BILATERAL BREAST MRI WITH AND WITHOUT CONTRAST  TECHNIQUE: Multiplanar, multisequence MR images of both breasts were obtained prior to and following the intravenous administration of 21m of MultiHance.  THREE-DIMENSIONAL MR IMAGE RENDERING ON INDEPENDENT WORKSTATION:  Three-dimensional MR images were rendered by post-processing of the original MR data on an independent workstation. The three-dimensional MR images were interpreted, and findings are reported in the following complete MRI report for this study. Three dimensional images were evaluated at the independent DynaCad workstation  COMPARISON: Previous examinations, including the bilateral diagnostic mammogram obtained earlier today and previous breast MR examinations, the most recent dated 04/26/2012.  FINDINGS: Breast composition: c: Heterogeneous fibroglandular tissue  Background parenchymal enhancement: Moderate  Right breast: No mass or abnormal enhancement.  Left breast: Less pronounced post lumpectomy changes on the left with resolution of previously demonstrated postradiation edema. No mass or abnormal enhancement.  Lymph nodes: No abnormal appearing lymph nodes.  Ancillary findings:  None.  IMPRESSION: No evidence of malignancy.  RECOMMENDATION: Bilateral diagnostic mammogram in 1 year.  BI-RADS CATEGORY 2: Benign.   Electronically Signed  By: SEnrique SackM.D.  On: 04/26/2014 17:51   ASSESSMENT: 38y.o.  BRCA-1 positive [place] woman,   (1)  with a complex cystic lesion documented [place] 2012 involving the upper inner quadrant of the left breast, measuring 4.3 cm by MRI, clinically T2 N0, biopsy showing a high-grade invasive ductal carcinoma which was triple negative with an elevated MIB-1,  (2) treated neoadjuvantly with 4 cycles of dose dense doxorubicin and cyclophosphamide followed by 3 of 12 planned doses of weekly paclitaxel,  discontinued because of concerns regarding neuropathy,  (4) status post definitive left lumpectomy and sentinel lymph node sampling 09/29/2011 for a residual ypT1c ypN0 invasive ductal carcinoma, grade 3  (5) adjuvant radiation completed February of 2012  (6) BRCA 1 mutation detected at [facility] May 2011 (IVS5--11T>G]  (a) breast density category b, yearly mammography/ tomography,breast MRI every other year  (b) biannual TVUS and yearly pelvic exam per Dr Marvel Lane (to age 66, then BSO)   Lane:  Shawnique is doing terrific and from a breast cancer point of view there is no evidence of disease activity or recurrence. We discussed her BRCA1 positivity, and she understands the standard of care is yearly breast MRI. However with her current insurance that would mean in Norma's bills every year area we are going to do yearly mammography with tomography and obtain breast MRI every other year or as the opportunity arises.  Similarly she understands bilateral salpingo-oophorectomy is recommended. Many women however postpone this procedure for the sake of childbearing, and that is what Luba is doing. Again, she is aware that we do not have a well-defined screening method for ovarian cancer. Given that uncertainty, what she is  currently doing is sensible and within NCCN guidelines.  She will see Dr. Lucia Gaskins in about 6 months. She will return to see me in 12 months. She knows to call for any problems that may develop before that visit.  Chauncey Cruel, MD     10/18/2014

## 2014-10-22 NOTE — Addendum Note (Signed)
Addended by: Laureen Abrahams on: 10/22/2014 05:49 PM   Modules accepted: Orders, Medications

## 2015-05-21 ENCOUNTER — Other Ambulatory Visit: Payer: Self-pay | Admitting: Oncology

## 2015-05-21 DIAGNOSIS — Z853 Personal history of malignant neoplasm of breast: Secondary | ICD-10-CM

## 2015-05-30 ENCOUNTER — Ambulatory Visit
Admission: RE | Admit: 2015-05-30 | Discharge: 2015-05-30 | Disposition: A | Discharge status: Home / Self Care | Payer: BLUE CROSS/BLUE SHIELD | Source: Ambulatory Visit | Attending: Oncology | Admitting: Oncology

## 2015-05-30 DIAGNOSIS — Z853 Personal history of malignant neoplasm of breast: Secondary | ICD-10-CM

## 2015-08-20 ENCOUNTER — Other Ambulatory Visit: Payer: BC Managed Care – PPO

## 2015-08-27 ENCOUNTER — Ambulatory Visit: Payer: BC Managed Care – PPO | Admitting: Oncology

## 2015-09-23 ENCOUNTER — Other Ambulatory Visit: Payer: Self-pay

## 2015-09-23 DIAGNOSIS — C50912 Malignant neoplasm of unspecified site of left female breast: Secondary | ICD-10-CM

## 2015-09-24 ENCOUNTER — Other Ambulatory Visit (HOSPITAL_BASED_OUTPATIENT_CLINIC_OR_DEPARTMENT_OTHER): Payer: BLUE CROSS/BLUE SHIELD

## 2015-09-24 DIAGNOSIS — C50912 Malignant neoplasm of unspecified site of left female breast: Secondary | ICD-10-CM

## 2015-09-24 LAB — CBC WITH DIFFERENTIAL/PLATELET
BASO%: 0.6 % (ref 0.0–2.0)
BASOS ABS: 0 10*3/uL (ref 0.0–0.1)
EOS%: 1.5 % (ref 0.0–7.0)
Eosinophils Absolute: 0.1 10*3/uL (ref 0.0–0.5)
HEMATOCRIT: 41.5 % (ref 34.8–46.6)
HGB: 13.8 g/dL (ref 11.6–15.9)
LYMPH#: 1.5 10*3/uL (ref 0.9–3.3)
LYMPH%: 18.6 % (ref 14.0–49.7)
MCH: 29.1 pg (ref 25.1–34.0)
MCHC: 33.1 g/dL (ref 31.5–36.0)
MCV: 87.8 fL (ref 79.5–101.0)
MONO#: 0.5 10*3/uL (ref 0.1–0.9)
MONO%: 5.8 % (ref 0.0–14.0)
NEUT#: 6 10*3/uL (ref 1.5–6.5)
NEUT%: 73.5 % (ref 38.4–76.8)
Platelets: 271 10*3/uL (ref 145–400)
RBC: 4.73 10*6/uL (ref 3.70–5.45)
RDW: 13.4 % (ref 11.2–14.5)
WBC: 8.2 10*3/uL (ref 3.9–10.3)

## 2015-09-24 LAB — COMPREHENSIVE METABOLIC PANEL (CC13)
ALT: 14 U/L (ref 0–55)
ANION GAP: 8 meq/L (ref 3–11)
AST: 12 U/L (ref 5–34)
Albumin: 3.6 g/dL (ref 3.5–5.0)
Alkaline Phosphatase: 80 U/L (ref 40–150)
BUN: 17.4 mg/dL (ref 7.0–26.0)
CALCIUM: 8.9 mg/dL (ref 8.4–10.4)
CO2: 24 mEq/L (ref 22–29)
Chloride: 106 mEq/L (ref 98–109)
Creatinine: 0.8 mg/dL (ref 0.6–1.1)
EGFR: >90 mL/min/{1.73_m2} (ref >90)
Glucose: 128 mg/dl (ref 70–140)
Potassium: 4.2 mEq/L (ref 3.5–5.1)
Sodium: 137 mEq/L (ref 136–145)
Total Bilirubin: 0.3 mg/dL (ref 0.20–1.20)
Total Protein: 6.9 g/dL (ref 6.4–8.3)

## 2015-10-01 ENCOUNTER — Ambulatory Visit (HOSPITAL_BASED_OUTPATIENT_CLINIC_OR_DEPARTMENT_OTHER): Payer: BLUE CROSS/BLUE SHIELD | Admitting: Oncology

## 2015-10-01 ENCOUNTER — Telehealth: Payer: Self-pay | Admitting: Oncology

## 2015-10-01 VITALS — BP 132/79 | HR 72 | Temp 98.2°F | Resp 18 | Ht 62.0 in | Wt 195.4 lb

## 2015-10-01 DIAGNOSIS — Z171 Estrogen receptor negative status [ER-]: Secondary | ICD-10-CM | POA: Diagnosis not present

## 2015-10-01 DIAGNOSIS — C50212 Malignant neoplasm of upper-inner quadrant of left female breast: Secondary | ICD-10-CM

## 2015-10-01 NOTE — Progress Notes (Signed)
ID: Crystal Lane   DOB: [birth year]  MR#: [id number]  YQI#:[id number]   PCP: [name], FNP GYN: Paula Compton, MD SU: Alphonsa Overall, MD OTHER MD: Arloa Koh, MD   CHIEF COMPLAINT:  BRCA1 positive Breast Cancer  CURRENT TREATMENT: Observation   HISTORY OF PRESENT ILLNESS: From the original intake note:  Crystal Lane found a mass in her left breast in January 2012.  She brought it to [employer] (Designer, jewellery, at [facility]) attention, but she says by the time she got to the clinic after a couple of weeks the mass had pretty much cleared.  It came back in [place], however, and this time she was referred for mammography where a study 02/16/2011 showed (per Dr. Amalia Hailey' notes, I do not have the actual report) a 4.2-cm lobulated mass as well as an enlarged left axillary node.  By ultrasound, the mass was partly solid partly cystic with a thickened.  Ultrasound of the left axilla showed a 1.7-cm node with an eccentrically thickened cortex.     On 5/11 the patient underwent ultrasound-guided needle core biopsy of the left breast mass, following aspiration of 10 mL of straw-colored fluid.  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  The MIB-1 was 82%.  With this information, the patient was referred to [facility] and she was set up for breast MRI 5/16, showing heterogeneously dense breasts, but in particular in the upper inner quadrant of the left breast a complex multilobulated partly cystic mass with a thick enhancing wall measuring altogether 4.3 cm.  The thickened wall of the lesion had rapid enhancement and washout, and the posterior margin of the lesion was close to but did not quite abut the pectoralis.  There was what looks like an intramammary lymph node in the posterior lateral left breast at the level of the nipple and  several normal-appearing left axillary nodes, but a 2-cm left axillary lymph node with an eccentrically thickened cortex was again noted.  The rest of the exam including the left internal mammary nodal area and the right side were unremarkable.  Since that procedure, the patient had a fine-needle aspiration of the suspicious left axillary lymph node whichwas benign, and she also had genetic testing which shows her to be BRCA-1 positive. She was treated neoadjuvantly as detailed below followed by surgery and radiation treatments completed February 2013.  Her subsequent history is as detailed below   INTERVAL HISTORY:  Crystal Lane returns today accompanied by her husband [name]. The interval history is generally "good". They are consolidating, moving to a nice her house in [place], and selling their old properties. They have been doing a lot of exercise just moving boxes. Otherwise she tries to get to the gym about 3 times a week.  REVIEW OF SYSTEMS: A detailed review of systems today was otherwise noncontributory  PAST MEDICAL HISTORY: Past Medical History  Diagnosis Date  . Complication of anesthesia     excessive talking while sedated  . Anxiety     due to dx. of breast CA  . Seasonal allergies     current non-prod. cough, congestion  . Breast cancer     left    PAST SURGICAL HISTORY: Past Surgical History  Procedure Laterality Date  . Appendectomy  09/02/2005  . Portacath placement  6/12  . Breast lumpectomy w/ needle localization  09/29/11    left /SLNBx  .  Breast lumpectomy  09/29/2011    Procedure: BREAST LUMPECTOMY WITH EXCISION OF SENTINEL NODE;  Surgeon: Shann Medal, MD;  Location: [place];  Service: General;  Laterality: Left;  [facility]le [facility] 7:30, SLNbx(nuc med  9:30)  . Port-a-cath removal  10/26/2011    Procedure: REMOVAL PORT-A-CATH;  Surgeon: Shann Medal, MD;  Location: [place];  Service: General;   Laterality: Right;    FAMILY HISTORY Family History  Problem Relation Age of Onset  . Diabetes Father   . Hyperlipidemia Father   The patient's mother is alive with a history of CIS of the cervix, status post laser treatment. The patient's mother has 2 sisters.  Neither the patient's mother nor the patient's mother's sisters, nor anyone in the patient's mother's family to their knowledge has any history of breast or ovarian cancer.  The patient's father is alive.  His mother may or may not have had ovarian cancer remotely.  She now has Alzheimer disease, and there are no records.  The patient's father had 2 sisters; currently age 89 and 25, neither of them have had breast or ovarian cancer.--The patient's mother, Crystal Lane, teaches reading in elementary school. Crystal Lane's brother Crystal Lane is a Scientist, forensic. He and his actress wife had baby [place]  2 years ago   GYNECOLOGIC HISTORY:  (Updated November 2016  Crystal Lane had menarche at age 50.   She is G0. 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 She divorced her first husband, Crystal [place], in March 2013. She married Crystal Lane in July 2014. He was recently promoted to a corporate level with [employer] so he travels during the week and his home one week) which is better than it was before). Crystal Lane has 3 children from a prior marriage him a living in [place], [place]. He has 1 grandchild. . The patient attends a [employer] locally.   ADVANCED DIRECTIVES: In place  HEALTH MAINTENANCE:  (Updated 02/05/2014) Social History  Substance Use Topics  . Smoking status: Never Smoker   . Smokeless tobacco: Never Used  . Alcohol Use: 0.0 oz/week     Comment: rare     Colonoscopy:   PAP: UTD/[name]  Bone density:   Lipid panel:    Allergies  Allergen Reactions  . Betadine [Povidone Iodine]  Nausea And Vomiting    The smell causes N/V  . Compazine Other (See Comments)    Causes patient to "pass out"  . Gelatin Rash    Ultrasound gel causes pt to have a rash     No current outpatient prescriptions on file.   No current facility-administered medications for this visit.    OBJECTIVE: Young white woman in no acute distress Filed Vitals:   10/01/15 1453  BP: 132/79  Pulse: 72  Temp: 98.2 F (36.8 C)  Resp: 18     Body mass index is 35.73 kg/(m^2).    ECOG FS: 0 Filed Weights   10/01/15 1453  Weight: 195 lb 6.4 oz (88.633 kg)   Sclerae unicteric, pupils round and equal Oropharynx clear and moist-- no thrush or other lesions No cervical or supraclavicular adenopathy Lungs no rales or rhonchi Heart regular rate and rhythm Abd soft, nontender, positive bowel sounds MSK no focal spinal tenderness, no upper extremity lymphedema Neuro: nonfocal, well oriented, appropriate affect Breasts: The right breast is unremarkable. The  left breast is status post lumpectomy and radiation. There is no evidence of local recurrence. Left axilla is benign.     LAB RESULTS: Lab Results  Component Value Date   WBC 8.2 09/24/2015   NEUTROABS 6.0 09/24/2015   HGB 13.8 09/24/2015   HCT 41.5 09/24/2015   MCV 87.8 09/24/2015   PLT 271 09/24/2015      Chemistry      Component Value Date/Time   NA 137 09/24/2015 0823   NA 138 04/26/2012 1444   K 4.2 09/24/2015 0823   K 4.1 04/26/2012 1444   CL 107 11/14/2012 0957   CL 104 04/26/2012 1444   CO2 24 09/24/2015 0823   CO2 26 04/26/2012 1444   BUN 17.4 09/24/2015 0823   BUN 14 04/26/2012 1444   CREATININE 0.8 09/24/2015 0823   CREATININE 0.85 04/26/2012 1444      Component Value Date/Time   CALCIUM 8.9 09/24/2015 0823   CALCIUM 8.7 04/26/2012 1444   ALKPHOS 80 09/24/2015 0823   ALKPHOS 76 04/26/2012 1444   AST 12 09/24/2015 0823   AST 11 04/26/2012 1444   ALT 14 09/24/2015 0823   ALT 9 04/26/2012 1444   BILITOT 0.30  09/24/2015 0823   BILITOT 0.3 04/26/2012 1444        STUDIES: CLINICAL DATA: History of left breast cancer status post lumpectomy and radiation therapy in 2012/2013.  EXAM: DIGITAL DIAGNOSTIC BILATERAL MAMMOGRAM WITH 3D TOMOSYNTHESIS AND CAD  COMPARISON: Previous exam(s).  ACR Breast Density Category b: There are scattered areas of fibroglandular density.  FINDINGS: There are stable postsurgical changes within the left breast. There are no dominant masses, suspicious calcifications or secondary signs of malignancy identified in either breast.  Mammographic images were processed with CAD.  IMPRESSION: Stable postsurgical changes within the left breast status post lumpectomy. No mammographic evidence of malignancy.  RECOMMENDATION: Screening mammogram in one year.(Code:SM-B-01Y)  I have discussed the findings and recommendations with the patient. Results were also provided in writing at the conclusion of the visit. If applicable, a reminder letter will be sent to the patient regarding the next appointment.  BI-RADS CATEGORY 2: Benign.   Electronically Signed  By: Franki Cabot M.D.  On: 05/30/2015 14:48    ASSESSMENT: 39 y.o.  BRCA-1 positive [place] woman,   (1)  with a complex cystic lesion documented [place] [facility] 2012 involving the upper inner quadrant of the left breast, measuring 4.3 cm by MRI, clinically T2 N0, biopsy showing a high-grade invasive ductal carcinoma which was triple negative with an elevated MIB-1,  (2) treated neoadjuvantly with 4 cycles of dose dense doxorubicin and cyclophosphamide followed by 3 of 12 planned doses of weekly paclitaxel, discontinued because of concerns regarding neuropathy,  (4) status post definitive left lumpectomy and sentinel lymph node sampling 09/29/2011 for a residual ypT1c ypN0 invasive ductal carcinoma, grade 3  (5) adjuvant radiation completed February of 2012  (6) BRCA 1 mutation detected at  [facility] May 2011 (IVS5--11T>G]  (a) breast density category b, yearly mammography/ tomography,breast MRI every other year  (b) biannual TVUS and yearly pelvic exam per Dr Marvel Plan (to age 41, then BSO)   PLAN:  Allaya is now 4 years out from her definitive surgery with no evidence of disease recurrence. This is very favorable.  Somehow she thought this was the fifth year, and therefore we might consider discontinuing follow-up. However I would be uncomfortable discontinuing follow-up until she undergoes bilateral salpingo-oophorectomy. This is scheduled at age 22. In addition at that  time she will have the option of starting either tamoxifen or an aromatase inhibitor for 5 years to cut the risk of her developing breast cancer in half. Accordingly I made her a return appointment here for a year from now.  It is favorable that her breast density is decreased and is now category B. This makes the mammography/tomography more sensitive. There are not in a position to have yearly MRIs at this point: She tells me she is still paying the $500 for her mammogram which her insurance for reasons unexplained me would not cover.  She knows to call for any problems that may develop before the next visit here. Chauncey Cruel, MD     10/01/2015

## 2015-10-01 NOTE — Telephone Encounter (Signed)
Appointments made and avs pritned for patient °

## 2016-05-25 ENCOUNTER — Other Ambulatory Visit: Payer: Self-pay | Admitting: Oncology

## 2016-05-25 DIAGNOSIS — Z853 Personal history of malignant neoplasm of breast: Secondary | ICD-10-CM

## 2016-05-25 DIAGNOSIS — Z9889 Other specified postprocedural states: Secondary | ICD-10-CM

## 2016-05-29 ENCOUNTER — Ambulatory Visit
Admission: RE | Admit: 2016-05-29 | Discharge: 2016-05-29 | Disposition: A | Discharge status: Home / Self Care | Payer: BLUE CROSS/BLUE SHIELD | Source: Ambulatory Visit | Attending: Oncology | Admitting: Oncology

## 2016-05-29 DIAGNOSIS — Z9889 Other specified postprocedural states: Secondary | ICD-10-CM

## 2016-05-29 DIAGNOSIS — Z853 Personal history of malignant neoplasm of breast: Secondary | ICD-10-CM

## 2016-06-18 ENCOUNTER — Other Ambulatory Visit: Payer: Self-pay

## 2016-06-18 DIAGNOSIS — C50212 Malignant neoplasm of upper-inner quadrant of left female breast: Secondary | ICD-10-CM

## 2016-06-19 ENCOUNTER — Other Ambulatory Visit (HOSPITAL_BASED_OUTPATIENT_CLINIC_OR_DEPARTMENT_OTHER): Payer: BLUE CROSS/BLUE SHIELD

## 2016-06-19 DIAGNOSIS — C50212 Malignant neoplasm of upper-inner quadrant of left female breast: Secondary | ICD-10-CM | POA: Diagnosis not present

## 2016-06-19 LAB — CBC WITH DIFFERENTIAL/PLATELET
BASO%: 0.6 % (ref 0.0–2.0)
Basophils Absolute: 0.1 10*3/uL (ref 0.0–0.1)
EOS%: 0.8 % (ref 0.0–7.0)
Eosinophils Absolute: 0.1 10*3/uL (ref 0.0–0.5)
HCT: 41.4 % (ref 34.8–46.6)
HGB: 13.8 g/dL (ref 11.6–15.9)
LYMPH%: 16.9 % (ref 14.0–49.7)
MCH: 28.8 pg (ref 25.1–34.0)
MCHC: 33.4 g/dL (ref 31.5–36.0)
MCV: 86.4 fL (ref 79.5–101.0)
MONO#: 0.4 10*3/uL (ref 0.1–0.9)
MONO%: 5 % (ref 0.0–14.0)
NEUT#: 6.7 10*3/uL — ABNORMAL HIGH (ref 1.5–6.5)
NEUT%: 76.7 % (ref 38.4–76.8)
Platelets: 260 10*3/uL (ref 145–400)
RBC: 4.79 10*6/uL (ref 3.70–5.45)
RDW: 13.5 % (ref 11.2–14.5)
WBC: 8.8 10*3/uL (ref 3.9–10.3)
lymph#: 1.5 10*3/uL (ref 0.9–3.3)

## 2016-06-19 LAB — COMPREHENSIVE METABOLIC PANEL
ALT: 16 U/L (ref 0–55)
ANION GAP: 10 meq/L (ref 3–11)
AST: 13 U/L (ref 5–34)
Albumin: 3.6 g/dL (ref 3.5–5.0)
Alkaline Phosphatase: 92 U/L (ref 40–150)
BUN: 16.4 mg/dL (ref 7.0–26.0)
CHLORIDE: 105 meq/L (ref 98–109)
CO2: 23 meq/L (ref 22–29)
Calcium: 9.1 mg/dL (ref 8.4–10.4)
Creatinine: 0.8 mg/dL (ref 0.6–1.1)
Glucose: 125 mg/dl (ref 70–140)
Potassium: 4.1 mEq/L (ref 3.5–5.1)
Sodium: 138 mEq/L (ref 136–145)
Total Bilirubin: 0.39 mg/dL (ref 0.20–1.20)
Total Protein: 7.1 g/dL (ref 6.4–8.3)

## 2016-06-25 ENCOUNTER — Ambulatory Visit (HOSPITAL_BASED_OUTPATIENT_CLINIC_OR_DEPARTMENT_OTHER): Payer: BLUE CROSS/BLUE SHIELD | Admitting: Oncology

## 2016-06-25 VITALS — BP 121/84 | HR 80 | Temp 98.2°F | Resp 18 | Ht 62.0 in | Wt 203.6 lb

## 2016-06-25 DIAGNOSIS — C50212 Malignant neoplasm of upper-inner quadrant of left female breast: Secondary | ICD-10-CM

## 2016-06-25 DIAGNOSIS — Z171 Estrogen receptor negative status [ER-]: Secondary | ICD-10-CM | POA: Diagnosis not present

## 2016-06-25 NOTE — Progress Notes (Signed)
ID: Crystal Lane   DOB: 06-25-1976  MR#: [id number]  YJE#:[id number]   PCP: [name], FNP GYN: Paula Compton, MD SU: Alphonsa Overall, MD OTHER MD: Arloa Koh, MD   CHIEF COMPLAINT:  BRCA1 positive Breast Cancer  CURRENT TREATMENT: Observation   HISTORY OF PRESENT ILLNESS: From the original intake note:  Crystal Lane found a mass in her left breast in January 2012.  She brought it to [employer] (Designer, jewellery, at [facility]) attention, but she says by the time she got to the clinic after a couple of weeks the mass had pretty much cleared.  It came back in [place], however, and this time she was referred for mammography where a study 02/16/2011 showed (per Dr. Amalia Hailey' notes, I do not have the actual report) a 4.2-cm lobulated mass as well as an enlarged left axillary node.  By ultrasound, the mass was partly solid partly cystic with a thickened.  Ultrasound of the left axilla showed a 1.7-cm node with an eccentrically thickened cortex.     On 5/11 the patient underwent ultrasound-guided needle core biopsy of the left breast mass, following aspiration of 10 mL of straw-colored fluid.  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  The MIB-1 was 82%.  With this information, the patient was referred to [facility] and she was set up for breast MRI 5/16, showing heterogeneously dense breasts, but in particular in the upper inner quadrant of the left breast a complex multilobulated partly cystic mass with a thick enhancing wall measuring altogether 4.3 cm.  The thickened wall of the lesion had rapid enhancement and washout, and the posterior margin of the lesion was close to but did not quite abut the pectoralis.  There was what looks like an intramammary lymph node in the posterior lateral left breast at the level of the nipple and  several normal-appearing left axillary nodes, but a 2-cm left axillary lymph node with an eccentrically thickened cortex was again noted.  The rest of the exam including the left internal mammary nodal area and the right side were unremarkable.  Since that procedure, the patient had a fine-needle aspiration of the suspicious left axillary lymph node whichwas benign, and she also had genetic testing which shows her to be BRCA-1 positive. She was treated neoadjuvantly as detailed below followed by surgery and radiation treatments completed February 2013.  Her subsequent history is as detailed below   INTERVAL HISTORY:  Crystal Lane returns today for follow-up of her BRCA1 positive breast cancer. Interval history is generally unremarkable. She just had her mammogram in July and her breasts are still category C. This is likely to continue while she is premenopausal.  REVIEW OF SYSTEMS: She has a very demanding job which also involves a fair amount of travel. She and her husband are thinking of moving closer to [place]. Because of that she has not been able to exercise as much as she would like and has gained some weight. A detailed review of systems today was otherwise stable  PAST MEDICAL HISTORY: Past Medical History:  Diagnosis Date  . Anxiety    due to dx. of breast CA  . Breast cancer    left  . Complication of anesthesia    excessive talking while sedated  . Seasonal allergies    current non-prod. cough, congestion    PAST SURGICAL HISTORY: Past Surgical History:  Procedure Laterality Date  .  APPENDECTOMY  09/02/2005  . BREAST LUMPECTOMY  09/29/2011   Procedure: BREAST LUMPECTOMY WITH EXCISION OF SENTINEL NODE;  Surgeon: Shann Medal, MD;  Location: [facility];  Service: General;  Laterality: Left;  Needle [facility] 7:30, SLNbx(nuc med  9:30)  . BREAST LUMPECTOMY W/ NEEDLE LOCALIZATION  09/29/11   left /SLNBx  . PORT-A-CATH REMOVAL  10/26/2011    Procedure: REMOVAL PORT-A-CATH;  Surgeon: Shann Medal, MD;  Location: [facility];  Service: General;  Laterality: Right;  . PORTACATH PLACEMENT  6/12    FAMILY HISTORY Family History  Problem Relation Age of Onset  . Diabetes Father   . Hyperlipidemia Father   The patient's mother is alive with a history of CIS of the cervix, status post laser treatment. The patient's mother has 2 sisters.  Neither the patient's mother nor the patient's mother's sisters, nor anyone in the patient's mother's family to their knowledge has any history of breast or ovarian cancer.  The patient's father is alive.  His mother may or may not have had ovarian cancer remotely.  She now has Alzheimer disease, and there are no records.  The patient's father had 2 sisters; currently age 30 and 10, neither of them have had breast or ovarian cancer.--The patient's mother, Crystal Lane, teaches reading in elementary school. Crystal Lane's brother Crystal Lane is a Scientist, forensic. He and his actress wife had baby Crystal Lane  2 years ago   GYNECOLOGIC HISTORY:  (Updated November 2016  Crystal Lane had menarche at age 14.   She is G0. 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 She divorced her first husband, Crystal Lane, in March 2013. She married Crystal Lane in July 2014. He was recently promoted to a corporate level with [employer] so he travels during the week and his home one week) which is better than it was before). Crystal Lane has 3 children from a prior marriage him a living in [place], [place], and [place]. He has 1 grandchild. . The patient attends a [employer] locally.   ADVANCED DIRECTIVES: In place  HEALTH MAINTENANCE:  (Updated 02/05/2014) Social History  Substance Use Topics  . Smoking status: Never Smoker  . Smokeless tobacco: Never Used  . Alcohol use 0.0 oz/week     Comment:  rare     Colonoscopy:   PAP: UTD/[name]  Bone density:   Lipid panel:    Allergies  Allergen Reactions  . Betadine [Povidone Iodine] Nausea And Vomiting    The smell causes N/V  . Compazine Other (See Comments)    Causes patient to "pass out"  . Gelatin Rash    Ultrasound gel causes pt to have a rash     No current outpatient prescriptions on file.   No current facility-administered medications for this visit.     OBJECTIVE: Young white woman who appears well Vitals:   06/25/16 1521  BP: 121/84  Pulse: 80  Resp: 18  Temp: 98.2 F (36.8 C)     Body mass index is 37.24 kg/m.    ECOG FS: 0 Filed Weights   06/25/16 1521  Weight: 203 lb 9.6 oz (92.4 kg)   Sclerae unicteric, EOMs intact Oropharynx clear and moist No cervical or supraclavicular adenopathy Lungs no rales or rhonchi Heart regular rate and rhythm Abd soft, nontender, positive bowel sounds MSK no focal spinal tenderness, no upper extremity lymphedema  Neuro: nonfocal, well oriented, appropriate affect Breasts: The right breast is unremarkable. The left breast is status post lumpectomy and radiation. There is no evidence of local recurrence. The left axilla is benign.  LAB RESULTS: Lab Results  Component Value Date   WBC 8.8 06/19/2016   NEUTROABS 6.7 (H) 06/19/2016   HGB 13.8 06/19/2016   HCT 41.4 06/19/2016   MCV 86.4 06/19/2016   PLT 260 06/19/2016      Chemistry      Component Value Date/Time   NA 138 06/19/2016 0853   K 4.1 06/19/2016 0853   CL 107 11/14/2012 0957   CO2 23 06/19/2016 0853   BUN 16.4 06/19/2016 0853   CREATININE 0.8 06/19/2016 0853      Component Value Date/Time   CALCIUM 9.1 06/19/2016 0853   ALKPHOS 92 06/19/2016 0853   AST 13 06/19/2016 0853   ALT 16 06/19/2016 0853   BILITOT 0.39 06/19/2016 0853        STUDIES: Mm Diag Breast Tomo Bilateral  Result Date: 05/29/2016 CLINICAL DATA:  Malignant lumpectomy of the upper inner left breast in 2012. Adjuvant  radiation therapy and chemotherapy. Annual evaluation. EXAM: 2D DIGITAL DIAGNOSTIC BILATERAL MAMMOGRAM WITH CAD AND ADJUNCT TOMO COMPARISON:  05/30/2015, 04/26/2014 and earlier. ACR Breast Density Category c: The breast tissue is heterogeneously dense, which may obscure small masses. FINDINGS: Standard 2D and tomosynthesis full field CC and MLO views of both breasts were obtained. An additional spot magnification tangential view of the lumpectomy site in the left breast was obtained. Scarring at the lumpectomy site in the upper inner left breast, unchanged. No new or suspicious findings in the left breast. No findings suspicious for malignancy in the right breast. Mammographic images were processed with CAD. IMPRESSION: No mammographic evidence of malignancy. Expected post lumpectomy changes in the left breast. RECOMMENDATION: Bilateral diagnostic mammography in 1 year. I have discussed the findings and recommendations with the patient. Results were also provided in writing at the conclusion of the visit. If applicable, a reminder letter will be sent to the patient regarding the next appointment. BI-RADS CATEGORY  2: Benign. Electronically Signed   By: Evangeline Dakin M.D.   On: 05/29/2016 11:20      ASSESSMENT: 40 y.o.  BRCA-1 positive [place] woman,   (1)  with a complex cystic lesion documented Rick 2012 involving the upper inner quadrant of the left breast, measuring 4.3 cm by MRI, clinically T2 N0, biopsy showing a high-grade invasive ductal carcinoma which was triple negative with an elevated MIB-1,  (2) treated neoadjuvantly with 4 cycles of dose dense doxorubicin and cyclophosphamide followed by 3 of 12 planned doses of weekly paclitaxel, discontinued because of concerns regarding neuropathy,  (4) status post definitive left lumpectomy and sentinel lymph node sampling 09/29/2011 for a residual ypT1c ypN0 invasive ductal carcinoma, grade 3  (5) adjuvant radiation completed February of  2012  (6) BRCA 1 mutation detected at [facility] May 2011 (IVS5--11T>G]  (a) breast density category b, yearly mammography/ tomography,breast MRI every other year  (b) biannual TVUS and yearly pelvic exam per Dr Marvel Plan (to age 10, then BSO)   PLAN:  Maziyah is now just about 5 years out from definitive surgery for her breast cancer, with no evidence of disease recurrence. This is very favorable.  We discussed BRCA1 follow-up approaches and these are basically the same as previously. In her case we are doing MRIs every other year for financial reasons, although the standard is yearly and most of  my patients in her situation alternating mammography with tomography and breast MRI every 6 months.  Since Lether just had her mammography last month, we could do an MRI of the breast in December or January. I don't know which would be preferable from an insurance point of view and she's, call us and let us know.  We again discussed the idea of bilateral salpingo-oophorectomy at the age of 74, but now that she is approaching that milestone especially with some any changes in her life right now she would like to "think about it some more". This means she will need her pelvic and transvaginal ultrasonography and gynecologic exam later this year.  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  She knows to call for any problems that may develop before the next visit.    Chauncey Cruel, MD     06/25/2016

## 2017-05-28 ENCOUNTER — Other Ambulatory Visit: Payer: Self-pay | Admitting: Oncology

## 2017-06-24 ENCOUNTER — Ambulatory Visit: Payer: BLUE CROSS/BLUE SHIELD | Admitting: Oncology

## 2017-06-24 ENCOUNTER — Other Ambulatory Visit: Payer: BLUE CROSS/BLUE SHIELD

## 2017-07-20 ENCOUNTER — Ambulatory Visit: Payer: BLUE CROSS/BLUE SHIELD | Admitting: Oncology

## 2021-11-29 ENCOUNTER — Other Ambulatory Visit: Payer: Self-pay | Admitting: Student

## 2021-11-29 DIAGNOSIS — Z853 Personal history of malignant neoplasm of breast: Secondary | ICD-10-CM

## 2021-12-02 ENCOUNTER — Other Ambulatory Visit: Payer: Self-pay | Admitting: Hematology and Oncology

## 2021-12-02 ENCOUNTER — Telehealth: Payer: Self-pay | Admitting: *Deleted

## 2021-12-02 DIAGNOSIS — N6320 Unspecified lump in the left breast, unspecified quadrant: Secondary | ICD-10-CM

## 2021-12-02 NOTE — Telephone Encounter (Signed)
Received referral from nurse navigator [place] with [facility]. Was informed she's scheduled for bx at [facility] on 1/19. Called pt, discussed referrals to see Dr. Donne Hazel for surgery and Dr. Lindi Adie for medical oncology. Confirmed appt with Dr. Lindi Adie on 2/2 at 1pm.  Pt able to be worked in at [facility] for 1/18 arrive at 7:10. Confirmed appt with pt. Sharee Pimple [facility] notified of pt new bx date and time.  Pt expressed appreciation for working her in a timely manner. Contact information provided for further questions or needs.

## 2021-12-03 ENCOUNTER — Ambulatory Visit
Admission: RE | Admit: 2021-12-03 | Discharge: 2021-12-03 | Disposition: A | Discharge status: Home / Self Care | Payer: BC Managed Care – PPO | Source: Ambulatory Visit | Attending: Hematology and Oncology | Admitting: Hematology and Oncology

## 2021-12-03 DIAGNOSIS — N6320 Unspecified lump in the left breast, unspecified quadrant: Secondary | ICD-10-CM

## 2021-12-03 HISTORY — PX: BREAST BIOPSY: SHX20

## 2021-12-04 ENCOUNTER — Encounter: Payer: Self-pay | Admitting: *Deleted

## 2021-12-04 ENCOUNTER — Telehealth: Payer: Self-pay | Admitting: *Deleted

## 2021-12-04 NOTE — Telephone Encounter (Signed)
Called pt with biopsy results of Invasive Ductal Carcinoma. Received verbal understanding. Informed pt prognostic markers will be out early next week. New pt appt with Dr. Lindi Adie scheduled and confirmed for 1/25 at 3pm Pt relate bx site doing well, no redness, swelling or drainage. Informed pt will work with Dr. Cristal Generous office for an appt. Encourage pt to call with needs. Pt relate need to process cancer dx and will call with questions or needs.

## 2021-12-09 ENCOUNTER — Encounter: Payer: Self-pay | Admitting: *Deleted

## 2021-12-09 DIAGNOSIS — Z170 Estrogen receptor positive status [ER+]: Secondary | ICD-10-CM | POA: Insufficient documentation

## 2021-12-09 NOTE — Progress Notes (Signed)
[facility] CONSULT NOTE  Patient Care Team: [name], [place] as PCP - General (Family Medicine) Verdell Carmine, MD as Consulting Physician (Internal Medicine) Donneta Romberg, MD (Inactive) as Consulting Physician (Obstetrics and Gynecology) Mauro Kaufmann, RN as Oncology Nurse Navigator  CHIEF COMPLAINTS/PURPOSE OF CONSULTATION:  Newly diagnosed left breast cancer  HISTORY OF PRESENTING ILLNESS:  Micayla M Esch 46 y.o. female is here because of recent diagnosis of invasive ductal carcinoma of the left breast.  She has a prior history of triple negative breast cancer in 2012 and she received neoadjuvant chemotherapy followed by surgery with lumpectomy and radiation.  She was diagnosed with BRCA1 gene mutation.  She felt pain and discomfort in the left breast and underwent further evaluation.  Mammogram revealed a 5.2 cm mass at 12 o'clock position.  Biopsy on 12/03/2021 showed recurrent histological grade 3 invasive ductal carcinoma of the left breast at 12:00, ER+(40%/PR-/Her2-). She presents to the clinic today for initial evaluation and discussion of treatment options.   I reviewed her records extensively and collaborated the history with the patient.  SUMMARY OF ONCOLOGIC HISTORY: Oncology History  Malignant neoplasm of upper-outer quadrant of left breast in female, estrogen receptor positive ([place])  03/2010 Miscellaneous   BRCA 1 mutation detected at [facility] May 2011 (IVS5--11T>G]   03/2011 - 08/2011 Neo-Adjuvant Chemotherapy   neoadjuvantly with 4 cycles of dose dense doxorubicin and cyclophosphamide followed by 3 of 12 planned doses of weekly paclitaxel, discontinued because of concerns regarding neuropathy   09/29/2011 Surgery   left lumpectomy and sentinel lymph node sampling 09/29/2011 for a residual ypT1c ypN0 invasive ductal carcinoma, grade 3   11/2011 - 12/2011 Radiation Therapy   adjuvant radiation    12/03/2021 Initial Diagnosis   Mammogram  detected left breast mass measuring 5.2 cm at 12 o'clock position biopsy on 12/03/2021 showed recurrent grade 3 IDC ER+(40%/PR-/Her2-).  Ki-67 40%     MEDICAL HISTORY:  Past Medical History:  Diagnosis Date   Anxiety    due to dx. of breast CA   Breast cancer ([place])    left   Complication of anesthesia    excessive talking while sedated   Seasonal allergies    current non-prod. cough, congestion    SURGICAL HISTORY: Past Surgical History:  Procedure Laterality Date   APPENDECTOMY  09/02/2005   BREAST BIOPSY Left 12/03/2021   BREAST LUMPECTOMY  09/29/2011   Procedure: BREAST LUMPECTOMY WITH EXCISION OF SENTINEL NODE;  Surgeon: Shann Medal, MD;  Location: [facility];  Service: General;  Laterality: Left;  Needle [facility] 7:30, SLNbx(nuc med  9:30)   BREAST LUMPECTOMY W/ NEEDLE LOCALIZATION  09/29/2011   left /SLNBx   PORT-A-CATH REMOVAL  10/26/2011   Procedure: REMOVAL PORT-A-CATH;  Surgeon: Shann Medal, MD;  Location: [facility];  Service: General;  Laterality: Right;   PORTACATH PLACEMENT  04/17/2011    SOCIAL HISTORY: Social History   Socioeconomic History   Marital status: Significant Other    Spouse name: Not on file   Number of children: Not on file   Years of education: Not on file   Highest education level: Not on file  Occupational History   Not on file  Tobacco Use   Smoking status: Never   Smokeless tobacco: Never  Substance and Sexual Activity   Alcohol use: Yes    Comment: rare   Drug use: No   Sexual activity: Yes  Other Topics Concern  Not on file  Social History Narrative   Not on file   Social Determinants of Health   Financial Resource Strain: Not on file  Food Insecurity: Not on file  Transportation Needs: Not on file  Physical Activity: Not on file  Stress: Not on file  Social Connections: Not on file  Intimate Partner Violence: Not on file    FAMILY HISTORY: Family History   Problem Relation Age of Onset   Diabetes Father    Hyperlipidemia Father     ALLERGIES:  is allergic to betadine [povidone iodine], compazine, and gelatin. Medications: Patient does not take any medication  REVIEW OF SYSTEMS:   Constitutional: Denies fevers, chills or abnormal night sweats Eyes: Denies blurriness of vision, double vision or watery eyes Ears, nose, mouth, throat, and face: Denies mucositis or sore throat Respiratory: Denies cough, dyspnea or wheezes Cardiovascular: Denies palpitation, chest discomfort or lower extremity swelling Gastrointestinal:  Denies nausea, heartburn or change in bowel habits Skin: Denies abnormal skin rashes Lymphatics: Denies new lymphadenopathy or easy bruising Neurological:Denies numbness, tingling or new weaknesses Behavioral/Psych: Mood is stable, no new changes  Breast:  Denies any palpable lumps or discharge All other systems were reviewed with the patient and are negative.  PHYSICAL EXAMINATION: ECOG PERFORMANCE STATUS: 1 - Symptomatic but completely ambulatory  Vitals:   12/10/21 1503  BP: (!) 156/83  Pulse: 85  Resp: 18  Temp: (!) 97.3 F (36.3 C)  SpO2: 97%   Filed Weights   12/10/21 1503  Weight: 196 lb 9.6 oz (89.2 kg)      LABORATORY DATA:  I have reviewed the data as listed Lab Results  Component Value Date   WBC 8.8 06/19/2016   HGB 13.8 06/19/2016   HCT 41.4 06/19/2016   MCV 86.4 06/19/2016   PLT 260 06/19/2016   Lab Results  Component Value Date   NA 138 06/19/2016   K 4.1 06/19/2016   CL 107 11/14/2012   CO2 23 06/19/2016    RADIOGRAPHIC STUDIES: I have personally reviewed the radiological reports and agreed with the findings in the report.  ASSESSMENT AND PLAN:  Malignant neoplasm of upper-outer quadrant of left breast in female, estrogen receptor positive ([place]) 12/03/2021: Mammogram detected left breast mass measuring 5.2 cm at 12 o'clock position biopsy on 12/03/2021 showed recurrent grade 3  IDC ER+(40%/PR-/Her2-).  Ki-67 40% (BRCA 1 mutation positive: Prior history: [place] 2012: 4.3 cm triple negative breast cancer status post NAC with AC-T followed by lumpectomy, radiation) This is a functional triple negative breast cancer  Pathology and radiology counseling: Discussed with the patient, the details of pathology including the type of breast cancer,the clinical staging, the significance of ER, PR and HER-2/neu receptors and the implications for treatment. After reviewing the pathology in detail, we proceeded to discuss the different treatment options between surgery, radiation, chemotherapy, antiestrogen therapies.  Treatment plan: 1.  Neoadjuvant chemotherapy with Taxotere and Cytoxan Pembrolizumab every 3 weeks x4 followed by maintenance immunotherapy for 1 year 2. bilateral mastectomies with reconstruction 3.  Adjuvant antiestrogen therapy 4.  Consideration for adjuvant olaparib based upon BRCA1 gene mutation 5.  CT CAP and bone scan will be ordered for staging  We will request port  Return to clinic to start chemotherapy in 2 weeks   All questions were answered. The patient knows to call the clinic with any problems, questions or concerns.   Rulon Eisenmenger, MD, MPH 12/10/2021    I, Thana Ates, am acting as Education administrator for [employer]  Lindi Adie, MD.  I have reviewed the above documentation for accuracy and completeness, and I agree with the above.

## 2021-12-10 ENCOUNTER — Inpatient Hospital Stay: Payer: BC Managed Care – PPO | Attending: Hematology and Oncology | Admitting: Hematology and Oncology

## 2021-12-10 ENCOUNTER — Other Ambulatory Visit: Payer: Self-pay

## 2021-12-10 ENCOUNTER — Encounter: Payer: Self-pay | Admitting: *Deleted

## 2021-12-10 ENCOUNTER — Inpatient Hospital Stay: Payer: BC Managed Care – PPO

## 2021-12-10 ENCOUNTER — Other Ambulatory Visit: Payer: Self-pay | Admitting: *Deleted

## 2021-12-10 VITALS — BP 156/83 | HR 85 | Temp 97.3°F | Resp 18 | Ht 62.0 in | Wt 196.6 lb

## 2021-12-10 DIAGNOSIS — Z9221 Personal history of antineoplastic chemotherapy: Secondary | ICD-10-CM | POA: Insufficient documentation

## 2021-12-10 DIAGNOSIS — C50412 Malignant neoplasm of upper-outer quadrant of left female breast: Secondary | ICD-10-CM

## 2021-12-10 DIAGNOSIS — Z170 Estrogen receptor positive status [ER+]: Secondary | ICD-10-CM | POA: Diagnosis not present

## 2021-12-10 DIAGNOSIS — Z1501 Genetic susceptibility to malignant neoplasm of breast: Secondary | ICD-10-CM | POA: Diagnosis not present

## 2021-12-10 DIAGNOSIS — Z923 Personal history of irradiation: Secondary | ICD-10-CM | POA: Insufficient documentation

## 2021-12-10 LAB — CMP (CANCER CENTER ONLY)
ALT: 13 U/L (ref 0–44)
AST: 15 U/L (ref 15–41)
Albumin: 4.2 g/dL (ref 3.5–5.0)
Alkaline Phosphatase: 80 U/L (ref 38–126)
Anion gap: 8 (ref 5–15)
BUN: 22 mg/dL — ABNORMAL HIGH (ref 6–20)
CO2: 27 mmol/L (ref 22–32)
Calcium: 9.7 mg/dL (ref 8.9–10.3)
Chloride: 100 mmol/L (ref 98–111)
Creatinine: 0.65 mg/dL (ref 0.44–1.00)
GFR, Estimated: >60 mL/min (ref >60)
Glucose, Bld: 162 mg/dL — ABNORMAL HIGH (ref 70–99)
Potassium: 4.4 mmol/L (ref 3.5–5.1)
Sodium: 135 mmol/L (ref 135–145)
Total Bilirubin: 0.1 mg/dL — ABNORMAL LOW (ref 0.3–1.2)
Total Protein: 7.4 g/dL (ref 6.5–8.1)

## 2021-12-10 LAB — CBC WITH DIFFERENTIAL (CANCER CENTER ONLY)
Abs Immature Granulocytes: 0.02 10*3/uL (ref 0.00–0.07)
Basophils Absolute: 0 10*3/uL (ref 0.0–0.1)
Basophils Relative: 1 %
Eosinophils Absolute: 0.2 10*3/uL (ref 0.0–0.5)
Eosinophils Relative: 2 %
HCT: 41.7 % (ref 36.0–46.0)
Hemoglobin: 13.8 g/dL (ref 12.0–15.0)
Immature Granulocytes: 0 %
Lymphocytes Relative: 16 %
Lymphs Abs: 1.3 10*3/uL (ref 0.7–4.0)
MCH: 29.1 pg (ref 26.0–34.0)
MCHC: 33.1 g/dL (ref 30.0–36.0)
MCV: 87.8 fL (ref 80.0–100.0)
Monocytes Absolute: 0.5 10*3/uL (ref 0.1–1.0)
Monocytes Relative: 6 %
Neutro Abs: 6.5 10*3/uL (ref 1.7–7.7)
Neutrophils Relative %: 75 %
Platelet Count: 319 10*3/uL (ref 150–400)
RBC: 4.75 MIL/uL (ref 3.87–5.11)
RDW: 12.8 % (ref 11.5–15.5)
WBC Count: 8.6 10*3/uL (ref 4.0–10.5)
nRBC: 0 % (ref 0.0–0.2)

## 2021-12-10 MED ORDER — DEXAMETHASONE 4 MG PO TABS: 4.0000 mg | ORAL_TABLET | Freq: Every day | ORAL | 1 refills | Status: DC

## 2021-12-10 MED ORDER — LIDOCAINE-PRILOCAINE 2.5-2.5 % EX CREA: TOPICAL_CREAM | CUTANEOUS | 3 refills | Status: DC

## 2021-12-10 MED ORDER — ONDANSETRON HCL 8 MG PO TABS: 8.0000 mg | ORAL_TABLET | Freq: Two times a day (BID) | ORAL | 1 refills | Status: DC | PRN

## 2021-12-10 MED ORDER — LORAZEPAM 0.5 MG PO TABS: 0.5000 mg | ORAL_TABLET | Freq: Four times a day (QID) | ORAL | 0 refills | Status: DC | PRN

## 2021-12-10 NOTE — Assessment & Plan Note (Signed)
12/03/2021: Mammogram detected left breast mass measuring 5.2 cm at 12 o'clock position biopsy on 12/03/2021 showed recurrent grade 3 IDC ER+(40%/PR-/Her2-).  Ki-67 40% (BRCA 1 mutation positive: Prior history: Crystal Lane 2012: 4.3 cm triple negative breast cancer status post NAC with AC-T followed by lumpectomy, radiation)  Pathology and radiology counseling: Discussed with the patient, the details of pathology including the type of breast cancer,the clinical staging, the significance of ER, PR and HER-2/neu receptors and the implications for treatment. After reviewing the pathology in detail, we proceeded to discuss the different treatment options between surgery, radiation, chemotherapy, antiestrogen therapies.  Treatment plan: 1.  Neoadjuvant chemotherapy with Taxotere and Cytoxan every 3 weeks x4 2. Mastectomy 3.  Adjuvant antiestrogen therapy  We will request port, chemo class Return to clinic to start chemotherapy in 2 weeks

## 2021-12-10 NOTE — Progress Notes (Signed)
START OFF PATHWAY REGIMEN - Breast   OFF00004:Docetaxel + Cyclophosphamide (TC):   A cycle is every 21 days:     Docetaxel      Cyclophosphamide    [id number]:Pembrolizumab 200 mg IV D1 q21 Days:   A cycle is every 21 days:     Pembrolizumab   **Always confirm dose/schedule in your pharmacy ordering system**  Patient Characteristics: Preoperative or Nonsurgical Candidate (Clinical Staging), Neoadjuvant Therapy followed by Surgery, Invasive Disease, Chemotherapy, HER2 Negative/Unknown/Equivocal, ER Positive Therapeutic Status: Preoperative or Nonsurgical Candidate (Clinical Staging) AJCC M Category: cM0 AJCC Grade: G3 Breast Surgical Plan: Neoadjuvant Therapy followed by Surgery ER Status: Positive (+) AJCC 8 Stage Grouping: IIIA HER2 Status: Negative (-) AJCC T Category: cT3 AJCC N Category: cN0 PR Status: Negative (-) Intent of Therapy: Curative Intent, Discussed with Patient

## 2021-12-11 ENCOUNTER — Encounter: Payer: Self-pay | Admitting: Licensed Clinical Social Worker

## 2021-12-11 ENCOUNTER — Encounter: Payer: Self-pay | Admitting: *Deleted

## 2021-12-11 ENCOUNTER — Telehealth: Payer: Self-pay | Admitting: *Deleted

## 2021-12-11 NOTE — Progress Notes (Signed)
[place] Work  Initial Assessment   Crystal Lane is a 46 y.o. year old female contacted by phone. Clinical Social Work was referred by [employer] (Art therapist) for psychosocial support.    Distress Screen completed: No No flowsheet data found.    Family/Social Information:  Housing Arrangement: patient lives alone. [place], lives in [place] Family members/support persons in your life? Family, fiance, work Transport planner concerns: no  Employment: Working full time. Works remote Income source: Education officer, community concerns: No Type of concern: None Services Currently in place:  checking on counseling benefits through work  Coping/ Adjustment to diagnosis: Patient understands treatment plan and what happens next? yes, she is approaching with a positive attitude and sense of humor. She is disappointed that she needs chemo and hopes to keep her hair by using the cold cap. She is also considering a bilateral mastectomy (hopefully nipple-sparing if possible).  Overall, is coping well and using what she learned about what works or does not work for her from her treatment 10 years ago. She plans to work as much as possible through treatment to help her mind stay occupied and be helpful to others. 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 Pt not interested in groups, but may be in some of the individual programs Provided CSW contact information and encouraged patient to call with any questions or concerns   Follow Up Plan:  CSW will see patient during infusion . Patient may call as needed between visits Patient verbalizes understanding of plan: Yes    Christeen Douglas , LCSW

## 2021-12-11 NOTE — Telephone Encounter (Signed)
Spoke to pt regarding next steps for treatment care plan. Referral placed for pt to see Dr. Iran Planas to discuss reconstruction. Pt relate she will plan to call on 1/27 to further discuss questions that both she and her fiance have. Pt with contact information and encouraged to when ready to further discuss.

## 2021-12-15 ENCOUNTER — Encounter: Payer: Self-pay | Admitting: *Deleted

## 2021-12-15 ENCOUNTER — Other Ambulatory Visit: Payer: Self-pay | Admitting: *Deleted

## 2021-12-15 DIAGNOSIS — C50412 Malignant neoplasm of upper-outer quadrant of left female breast: Secondary | ICD-10-CM

## 2021-12-15 DIAGNOSIS — Z170 Estrogen receptor positive status [ER+]: Secondary | ICD-10-CM

## 2021-12-16 ENCOUNTER — Other Ambulatory Visit: Payer: Self-pay | Admitting: *Deleted

## 2021-12-16 ENCOUNTER — Encounter: Payer: Self-pay | Admitting: *Deleted

## 2021-12-16 ENCOUNTER — Telehealth: Payer: Self-pay | Admitting: Hematology and Oncology

## 2021-12-16 ENCOUNTER — Telehealth: Payer: Self-pay | Admitting: *Deleted

## 2021-12-16 DIAGNOSIS — C50412 Malignant neoplasm of upper-outer quadrant of left female breast: Secondary | ICD-10-CM

## 2021-12-16 MED ORDER — ONDANSETRON HCL 8 MG PO TABS: 8.0000 mg | ORAL_TABLET | Freq: Two times a day (BID) | ORAL | 1 refills | Status: DC | PRN

## 2021-12-16 MED ORDER — NAPROXEN 500 MG PO TABS: 500.0000 mg | ORAL_TABLET | Freq: Two times a day (BID) | ORAL | 3 refills | Status: DC

## 2021-12-16 MED ORDER — OMEPRAZOLE 20 MG PO CPDR: 20.0000 mg | DELAYED_RELEASE_CAPSULE | Freq: Every day | ORAL | 6 refills | Status: AC

## 2021-12-16 MED ORDER — PROCHLORPERAZINE MALEATE 10 MG PO TABS: 10.0000 mg | ORAL_TABLET | Freq: Four times a day (QID) | ORAL | 6 refills | Status: AC | PRN

## 2021-12-16 MED ORDER — DEXAMETHASONE 4 MG PO TABS: 4.0000 mg | ORAL_TABLET | Freq: Every day | ORAL | 1 refills | Status: DC

## 2021-12-16 MED ORDER — LIDOCAINE-PRILOCAINE 2.5-2.5 % EX CREA: TOPICAL_CREAM | CUTANEOUS | 3 refills | Status: DC

## 2021-12-16 NOTE — Telephone Encounter (Signed)
Spoke with pt and family regarding proceeding with chemo prior to surgery as well as anti-nausea medication. Pt inform she is not able to tolerate lorazepam as previously put her into a "deep depression and I couldn't form words" Pt discussed that compazine is listed as an allergery although she's not sure that is accurate and has requested a prescription for compazine for anti-nausea medications. Also c/o breast pain at site of tumor 8-10 out of 10. Has noted that naproxen has helped with pain previously and has asked if a prescription can be sent in. Pt with concerns of reflux from chemo as it was a previous issue and requested prescription for this as well. Dr. Lindi Adie notified of pt concerns and requests.

## 2021-12-16 NOTE — Telephone Encounter (Signed)
Sch per 1/30 inbasket, pt aware

## 2021-12-17 ENCOUNTER — Ambulatory Visit (HOSPITAL_COMMUNITY): Payer: BC Managed Care – PPO

## 2021-12-17 NOTE — Pre-Procedure Instructions (Signed)
Surgical Instructions    Your procedure is scheduled on Thursday, February 9th.  Report to [facility] Main Entrance "A" at 05:30 A.M., then check in with the Admitting office.  Call this number if you have problems the morning of surgery:  [phone]   If you have any questions prior to your surgery date call [phone]: Open Monday-Friday 8am-4pm    Remember:  Do not eat after midnight the night before your surgery  You may drink clear liquids until 04:30 AM the morning of your surgery.   Clear liquids allowed are: Water, Non-Citrus Juices (without pulp), Carbonated Beverages, Clear Tea, Black Coffee Only (NO MILK, CREAM OR POWDERED CREAMER of any kind), and Gatorade.    Take these medicines the morning of surgery with A SIP OF WATER  dexamethasone (DECADRON) omeprazole (PRILOSEC)   If needed: ondansetron (ZOFRAN)  prochlorperazine (COMPAZINE)  As of today, STOP taking any Aspirin (unless otherwise instructed by your surgeon) Aleve, Naproxen, Ibuprofen, Motrin, Advil, Goody's, BC's, all herbal medications, fish oil, and all vitamins. This includes naproxen (NAPROSYN).                     Do NOT Smoke (Tobacco/Vaping) for 24 hours prior to your procedure.  If you use a CPAP at night, you may bring your mask/headgear for your overnight stay.   Contacts, glasses, piercing's, hearing aid's, dentures or partials may not be worn into surgery, please bring cases for these belongings.    For patients admitted to the hospital, discharge time will be determined by your treatment team.   Patients discharged the day of surgery will not be allowed to drive home, and someone needs to stay with them for 24 hours.  NO VISITORS WILL BE ALLOWED IN PRE-OP WHERE PATIENTS ARE PREPPED FOR SURGERY.  ONLY 1 SUPPORT PERSON MAY BE PRESENT IN THE WAITING ROOM WHILE YOU ARE IN SURGERY.  IF YOU ARE TO BE ADMITTED, ONCE YOU ARE IN YOUR ROOM YOU WILL BE ALLOWED TWO (2) VISITORS. (1) VISITOR MAY STAY  OVERNIGHT BUT MUST ARRIVE TO THE ROOM BY 8pm.  Minor children may have two parents present. Special consideration for safety and communication needs will be reviewed on a case by case basis.   Special instructions:   [place]- Preparing For Surgery  Before surgery, you can play an important role. Because skin is not sterile, your skin needs to be as free of germs as possible. You can reduce the number of germs on your skin by washing with CHG (chlorahexidine gluconate) Soap before surgery.  CHG is an antiseptic cleaner which kills germs and bonds with the skin to continue killing germs even after washing.    Oral Hygiene is also important to reduce your risk of infection.  Remember - BRUSH YOUR TEETH THE MORNING OF SURGERY WITH YOUR REGULAR TOOTHPASTE  Please do not use if you have an allergy to CHG or antibacterial soaps. If your skin becomes reddened/irritated stop using the CHG.  Do not shave (including legs and underarms) for at least 48 hours prior to first CHG shower. It is OK to shave your face.  Please follow these instructions carefully.   Shower the NIGHT BEFORE SURGERY and the MORNING OF SURGERY  If you chose to wash your hair, wash your hair first as usual with your normal shampoo.  After you shampoo, rinse your hair and body thoroughly to remove the shampoo.  Use CHG Soap as you would any other liquid soap. You can  apply CHG directly to the skin and wash gently with a scrungie or a clean washcloth.   Apply the CHG Soap to your body ONLY FROM THE NECK DOWN.  Do not use on open wounds or open sores. Avoid contact with your eyes, ears, mouth and genitals (private parts). Wash Face and genitals (private parts)  with your normal soap.   Wash thoroughly, paying special attention to the area where your surgery will be performed.  Thoroughly rinse your body with warm water from the neck down.  DO NOT shower/wash with your normal soap after using and rinsing off the CHG  Soap.  Pat yourself dry with a CLEAN TOWEL.  Wear CLEAN PAJAMAS to bed the night before surgery  Place CLEAN SHEETS on your bed the night before your surgery  DO NOT SLEEP WITH PETS.   Day of Surgery: Shower with CHG soap. Do not wear jewelry, make up, nail polish, gel polish, artificial nails, or any other type of covering on natural nails including finger and toenails. If patients have artificial nails, gel coating, etc. that need to be removed by a nail salon please have this removed prior to surgery. Surgery may need to be canceled/delayed if the surgeon/anesthesiologist feels like the patient is unable to be adequately monitored. Do not wear lotions, powders, perfumes, or deodorant. Do not shave 48 hours prior to surgery.   Do not bring valuables to the hospital. [facility] is not responsible for any belongings or valuables. Wear Clean/Comfortable clothing the morning of surgery Remember to brush your teeth WITH YOUR REGULAR TOOTHPASTE.   Please read over the following fact sheets that you were given.   3 days prior to your procedure or After your COVID test   You are not required to quarantine however you are required to wear a well-fitting mask when you are out and around people not in your household. If your mask becomes wet or soiled, replace with a new one.   Wash your hands often with soap and water for 20 seconds or clean your hands with an alcohol-based hand sanitizer that contains at least 60% alcohol.   Do not share personal items.   Notify your provider:  o if you are in close contact with someone who has COVID  o or if you develop a fever of 100.4 or greater, sneezing, cough, sore throat, shortness of breath or body aches.

## 2021-12-18 ENCOUNTER — Encounter (HOSPITAL_COMMUNITY): Payer: Self-pay

## 2021-12-18 ENCOUNTER — Other Ambulatory Visit: Payer: Self-pay | Admitting: *Deleted

## 2021-12-18 ENCOUNTER — Encounter (HOSPITAL_COMMUNITY)
Admission: RE | Admit: 2021-12-18 | Discharge: 2021-12-18 | Disposition: A | Discharge status: Home / Self Care | Payer: BC Managed Care – PPO | Source: Ambulatory Visit | Attending: General Surgery | Admitting: General Surgery

## 2021-12-18 ENCOUNTER — Other Ambulatory Visit: Payer: Self-pay | Admitting: General Surgery

## 2021-12-18 ENCOUNTER — Ambulatory Visit: Payer: BLUE CROSS/BLUE SHIELD | Admitting: Hematology and Oncology

## 2021-12-18 ENCOUNTER — Encounter: Payer: Self-pay | Admitting: *Deleted

## 2021-12-18 ENCOUNTER — Other Ambulatory Visit: Payer: Self-pay

## 2021-12-18 DIAGNOSIS — Z01812 Encounter for preprocedural laboratory examination: Secondary | ICD-10-CM | POA: Insufficient documentation

## 2021-12-18 HISTORY — DX: Gastro-esophageal reflux disease without esophagitis: K21.9

## 2021-12-18 MED ORDER — ONDANSETRON 8 MG PO TBDP: 8.0000 mg | ORAL_TABLET | Freq: Three times a day (TID) | ORAL | 1 refills | Status: DC | PRN

## 2021-12-18 NOTE — Pre-Procedure Instructions (Signed)
Surgical Instructions    Your procedure is scheduled on Thursday, February 9th.  Report to [facility] Main Entrance "A" at 05:30 A.M., then check in with the Admitting office.  Call this number if you have problems the morning of surgery:  [phone]   If you have any questions prior to your surgery date call [phone]: Open Monday-Friday 8am-4pm   Remember:  Do not eat after midnight the night before your surgery  You may drink clear liquids until 04:30 AM the morning of your surgery.   Clear liquids allowed are: Water, Non-Citrus Juices (without pulp), Carbonated Beverages, Clear Tea, Black Coffee Only (NO MILK, CREAM OR POWDERED CREAMER of any kind), and Gatorade.    Take these medicines the morning of surgery with A SIP OF WATER  omeprazole (PRILOSEC)  If needed: ondansetron (ZOFRAN)  prochlorperazine (COMPAZINE)  5 Days prior to surgery, STOP taking any Aspirin (unless otherwise instructed by your surgeon) Aleve, Naproxen, Ibuprofen, Motrin, Advil, Goody's, BC's, all herbal medications, fish oil, and all vitamins. This includes naproxen (NAPROSYN).                    Do NOT Smoke (Tobacco/Vaping) for 24 hours prior to your procedure.  If you use a CPAP at night, you may bring your mask/headgear for your overnight stay.   Contacts, glasses, piercing's, hearing aid's, dentures or partials may not be worn into surgery, please bring cases for these belongings.    For patients admitted to the hospital, discharge time will be determined by your treatment team.   Patients discharged the day of surgery will not be allowed to drive home, and someone needs to stay with them for 24 hours.  NO VISITORS WILL BE ALLOWED IN PRE-OP WHERE PATIENTS ARE PREPPED FOR SURGERY.  ONLY 1 SUPPORT PERSON MAY BE PRESENT IN THE WAITING ROOM WHILE YOU ARE IN SURGERY.  IF YOU ARE TO BE ADMITTED, ONCE YOU ARE IN YOUR ROOM YOU WILL BE ALLOWED TWO (2) VISITORS. (1) VISITOR MAY STAY OVERNIGHT BUT MUST  ARRIVE TO THE ROOM BY 8pm.  Minor children may have two parents present. Special consideration for safety and communication needs will be reviewed on a case by case basis.   Special instructions:   [place]- Preparing For Surgery  Before surgery, you can play an important role. Because skin is not sterile, your skin needs to be as free of germs as possible. You can reduce the number of germs on your skin by washing with CHG (chlorahexidine gluconate) Soap before surgery.  CHG is an antiseptic cleaner which kills germs and bonds with the skin to continue killing germs even after washing.    Oral Hygiene is also important to reduce your risk of infection.  Remember - BRUSH YOUR TEETH THE MORNING OF SURGERY WITH YOUR REGULAR TOOTHPASTE  Please do not use if you have an allergy to CHG or antibacterial soaps. If your skin becomes reddened/irritated stop using the CHG.  Do not shave (including legs and underarms) for at least 48 hours prior to first CHG shower.   Please follow these instructions carefully.   Shower the NIGHT BEFORE SURGERY and the MORNING OF SURGERY with an Antibacterial Soap such as Midwife.  Pat yourself dry with a CLEAN TOWEL.  Wear CLEAN PAJAMAS to bed the night before surgery  Place CLEAN SHEETS on your bed the night before your surgery  DO NOT SLEEP WITH PETS.   Day of Surgery: Shower with Antibacterial Soap Do  not wear jewelry, make up, nail polish, gel polish, artificial nails, or any other type of covering on natural nails including finger and toenails. If patients have artificial nails, gel coating, etc. that need to be removed by a nail salon please have this removed prior to surgery. Surgery may need to be canceled/delayed if the surgeon/anesthesiologist feels like the patient is unable to be adequately monitored. Do not wear lotions, powders, perfumes, or deodorant. Do not shave 48 hours prior to surgery.   Do not bring valuables to the hospital. [facility] is not responsible for any belongings or valuables. Wear Clean/Comfortable clothing the morning of surgery Remember to brush your teeth WITH YOUR REGULAR TOOTHPASTE.   Please read over the following fact sheets that you were given.   3 days prior to your procedure    You are not required to quarantine however you are required to wear a well-fitting mask when you are out and around people not in your household. If your mask becomes wet or soiled, replace with a new one.   Wash your hands often with soap and water for 20 seconds or clean your hands with an alcohol-based hand sanitizer that contains at least 60% alcohol.   Do not share personal items.   Notify your provider:  o if you are in close contact with someone who has COVID  o or if you develop a fever of 100.4 or greater, sneezing, cough, sore throat, shortness of breath or body aches.

## 2021-12-18 NOTE — Progress Notes (Signed)
PCP: [facility]--looking for a new PCP Cardiologist: denies  EKG: n/a CXR: n/a ECHO: denies Stress Test: denies Cardiac Cath: denies  ERAS: clears until 4:30  Covid: No, ambulatory  Labs: CBC, CMP 12/10/21 in EPIC  Patient denies shortness of breath, fever, cough, and chest pain at PAT appointment.  Patient verbalized understanding of instructions provided today at the PAT appointment.  Patient asked to review instructions at home and day of surgery.

## 2021-12-22 ENCOUNTER — Encounter (HOSPITAL_COMMUNITY)
Admission: RE | Admit: 2021-12-22 | Discharge: 2021-12-22 | Disposition: A | Discharge status: Home / Self Care | Payer: BC Managed Care – PPO | Source: Ambulatory Visit | Attending: Hematology and Oncology | Admitting: Hematology and Oncology

## 2021-12-22 ENCOUNTER — Ambulatory Visit (HOSPITAL_COMMUNITY)
Admission: RE | Admit: 2021-12-22 | Discharge: 2021-12-22 | Disposition: A | Discharge status: Home / Self Care | Payer: BC Managed Care – PPO | Source: Ambulatory Visit | Attending: Hematology and Oncology | Admitting: Hematology and Oncology

## 2021-12-22 ENCOUNTER — Other Ambulatory Visit: Payer: Self-pay

## 2021-12-22 ENCOUNTER — Encounter (HOSPITAL_COMMUNITY): Payer: Self-pay

## 2021-12-22 DIAGNOSIS — C50412 Malignant neoplasm of upper-outer quadrant of left female breast: Secondary | ICD-10-CM

## 2021-12-22 DIAGNOSIS — Z170 Estrogen receptor positive status [ER+]: Secondary | ICD-10-CM | POA: Diagnosis present

## 2021-12-22 MED ORDER — IOHEXOL 300 MG/ML  SOLN
100.0000 mL | Freq: Once | INTRAMUSCULAR | Status: AC | PRN
  Administered 2021-12-22: 100 mL via INTRAVENOUS

## 2021-12-22 MED ORDER — TECHNETIUM TC 99M MEDRONATE IV KIT
20.0000 | PACK | Freq: Once | INTRAVENOUS | Status: AC | PRN
  Administered 2021-12-22: 19.2 via INTRAVENOUS

## 2021-12-22 MED ORDER — SODIUM CHLORIDE (PF) 0.9 % IJ SOLN
INTRAMUSCULAR | Status: AC
  Filled 2021-12-22: qty 50

## 2021-12-24 ENCOUNTER — Encounter (HOSPITAL_COMMUNITY): Payer: Self-pay | Admitting: General Surgery

## 2021-12-24 ENCOUNTER — Other Ambulatory Visit: Payer: Self-pay | Admitting: *Deleted

## 2021-12-24 DIAGNOSIS — C50412 Malignant neoplasm of upper-outer quadrant of left female breast: Secondary | ICD-10-CM

## 2021-12-24 DIAGNOSIS — Z170 Estrogen receptor positive status [ER+]: Secondary | ICD-10-CM

## 2021-12-24 NOTE — Progress Notes (Signed)
Pharmacist Chemotherapy Monitoring - Initial Assessment    Anticipated start date: 12/31/21   The following has been reviewed per standard work regarding the patient's treatment regimen: The patient's diagnosis, treatment plan and drug doses, and organ/hematologic function Lab orders and baseline tests specific to treatment regimen  The treatment plan start date, drug sequencing, and pre-medications Prior authorization status  Patient's documented medication list, including drug-drug interaction screen and prescriptions for anti-emetics and supportive care specific to the treatment regimen The drug concentrations, fluid compatibility, administration routes, and timing of the medications to be used The patient's access for treatment and lifetime cumulative dose history, if applicable  The patient's medication allergies and previous infusion related reactions, if applicable   Changes made to treatment plan:  treatment plan date  Follow up needed:  Pending authorization for treatment  Need urine pregnancy testing during tx?    Kennith Center, Pharm.D., CPP 12/24/2021@2 :56 PM

## 2021-12-24 NOTE — H&P (Signed)
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 This was followed by chemotherapy and radiation. She was noted to have a BRCA1 mutation at that time. She recently noticed a left upper outer quadrant breast mass. This is causing her pain. She has no nipple discharge. Ultrasound shows a 5.2 x 4.2 x 3.5 cm mass. Axillary ultrasound is negative. 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 She has been seen by oncology and has staging scans pending.  Review of Systems: A complete review of systems was obtained from the patient. I have reviewed this information and discussed as appropriate with the patient. See HPI as well for other ROS.  Review of Systems  All other systems reviewed and are negative.   Medical History: Past Medical History:  Diagnosis Date   GERD (gastroesophageal reflux disease)   History of cancer   Patient Active Problem List  Diagnosis   Malignant neoplasm of upper-outer quadrant of left breast in female, estrogen receptor positive (CMS-HCC)   Past Surgical History:  Procedure Laterality Date   APPENDECTOMY   breast surgery  left breast lumpectomy 2012   DEEP AXILLARY SENTINEL NODE BIOPSY / EXCISION Left    Allergies  Allergen Reactions   Lorazepam Other (See Comments)  Depression, inability to form words   Gel Rash  Ultrasound Blue Gel   Povidone-Iodine Itching, Nausea And Vomiting and Hives  The smell causes N/V The smell causes N/V The smell causes N/V   Prochlorperazine Hives and Other (See Comments)  Causes patient to "pass out" Causes patient to "pass out"   Current Outpatient Medications on File Prior to Visit  Medication Sig Dispense Refill   naproxen (NAPROSYN) 500 MG tablet   No current facility-administered medications on file prior to visit.    Family History  Problem Relation Age of Onset   High blood pressure (Hypertension) Mother   High blood pressure (Hypertension) Father   Diabetes Father    Social History   Tobacco Use  Smoking Status Never  Smokeless Tobacco Never    Social History   Socioeconomic History   Marital status: Married  Tobacco Use   Smoking status: Never   Smokeless tobacco: Never  Substance and Sexual Activity   Alcohol use: Not Currently   Drug use: Not Currently   Objective:   Vitals:  12/18/21 0834  BP: 124/76  Pulse: 93  Weight: 89.4 kg (197 lb)  Height: 157.5 cm (_0 )   Body mass index is 36.03 kg/m.  Physical Exam Vitals reviewed.  Constitutional:  Appearance: Normal appearance.  Chest:  Breasts: Right: No inverted nipple, mass or nipple discharge.  Left: Mass present. No inverted nipple or nipple discharge.   Comments: 4 cm mass luoq not mobile Lymphadenopathy:  Upper Body:  Right upper body: No supraclavicular or axillary adenopathy.  Left upper body: No supraclavicular or axillary adenopathy.  Neurological:  Mental Status: She is alert.     Assessment and Plan:   Malignant neoplasm of upper-outer quadrant of left breast in female, estrogen receptor positive (CMS-HCC)  Port placement, primary systemic therapy  We discussed a right internal jugular port placement next week. 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 We will plan for chemotherapy and then reassess her at the end of chemotherapy  to decide what surgery. She does desire bilateral mastectomies at this point. I think that is the most reasonable plan certainly on the left where she has another cancer after breast conservation therapy but also on the right due to her young age and the BRCA mutation. She is very interested in nipple sparing mastectomies with reconstruction. We discussed that I am not entirely sure that she is the best candidate for that. She  will see plastic surgery. I think on the left side at least right now there is no way that she is going to be able to have a nipple sparing mastectomy due to the size of the tumor and its impact on the skin. I told her that most likely we would not be able to do a nipple sparing mastectomy but this is going to depend on her response over the long-term. I did discuss that I would attempt to do another sentinel node on the left side. If that were not technically possible which it certainly may not be due to her prior surgery and the location and size of this mass I would not pursue an axillary lymph node dissection. I will plan on seeing her at the end of chemotherapy. No follow-ups on file.

## 2021-12-25 ENCOUNTER — Encounter (HOSPITAL_COMMUNITY)
Admission: RE | Disposition: A | Discharge status: Home / Self Care | Payer: Self-pay | Source: Home / Self Care | Attending: General Surgery

## 2021-12-25 ENCOUNTER — Ambulatory Visit (HOSPITAL_COMMUNITY): Payer: BC Managed Care – PPO

## 2021-12-25 ENCOUNTER — Ambulatory Visit (HOSPITAL_COMMUNITY)
Admission: RE | Admit: 2021-12-25 | Discharge: 2021-12-25 | Disposition: A | Discharge status: Home / Self Care | Payer: BC Managed Care – PPO | Attending: General Surgery | Admitting: General Surgery

## 2021-12-25 ENCOUNTER — Encounter: Payer: Self-pay | Admitting: *Deleted

## 2021-12-25 ENCOUNTER — Ambulatory Visit (HOSPITAL_COMMUNITY): Payer: BC Managed Care – PPO | Admitting: General Practice

## 2021-12-25 ENCOUNTER — Other Ambulatory Visit: Payer: Self-pay

## 2021-12-25 ENCOUNTER — Encounter (HOSPITAL_COMMUNITY): Payer: Self-pay | Admitting: General Surgery

## 2021-12-25 DIAGNOSIS — K219 Gastro-esophageal reflux disease without esophagitis: Secondary | ICD-10-CM | POA: Insufficient documentation

## 2021-12-25 DIAGNOSIS — Z170 Estrogen receptor positive status [ER+]: Secondary | ICD-10-CM | POA: Insufficient documentation

## 2021-12-25 DIAGNOSIS — Z9889 Other specified postprocedural states: Secondary | ICD-10-CM

## 2021-12-25 DIAGNOSIS — F419 Anxiety disorder, unspecified: Secondary | ICD-10-CM | POA: Insufficient documentation

## 2021-12-25 DIAGNOSIS — Z1501 Genetic susceptibility to malignant neoplasm of breast: Secondary | ICD-10-CM | POA: Diagnosis not present

## 2021-12-25 DIAGNOSIS — C50412 Malignant neoplasm of upper-outer quadrant of left female breast: Secondary | ICD-10-CM | POA: Insufficient documentation

## 2021-12-25 DIAGNOSIS — C50919 Malignant neoplasm of unspecified site of unspecified female breast: Secondary | ICD-10-CM

## 2021-12-25 HISTORY — PX: PORTACATH PLACEMENT: SHX2246

## 2021-12-25 LAB — POCT PREGNANCY, URINE
Preg Test, Ur: NEGATIVE
Preg Test, Ur: NEGATIVE

## 2021-12-25 SURGERY — INSERTION, TUNNELED CENTRAL VENOUS DEVICE, WITH PORT
Anesthesia: General | Site: Chest | Laterality: Right

## 2021-12-25 MED ORDER — HEPARIN SOD (PORK) LOCK FLUSH 100 UNIT/ML IV SOLN
INTRAVENOUS | Status: DC | PRN
  Administered 2021-12-25: 500 [IU] via INTRAVENOUS

## 2021-12-25 MED ORDER — PROPOFOL 10 MG/ML IV BOLUS
INTRAVENOUS | Status: DC | PRN
  Administered 2021-12-25 (×2): 10 mg via INTRAVENOUS
  Administered 2021-12-25: 200 mg via INTRAVENOUS

## 2021-12-25 MED ORDER — HEPARIN 6000 UNIT IRRIGATION SOLUTION
Status: DC | PRN
  Administered 2021-12-25: 1

## 2021-12-25 MED ORDER — HEPARIN SOD (PORK) LOCK FLUSH 100 UNIT/ML IV SOLN
INTRAVENOUS | Status: AC
  Filled 2021-12-25: qty 5

## 2021-12-25 MED ORDER — ORAL CARE MOUTH RINSE
15.0000 mL | Freq: Once | OROMUCOSAL | Status: AC
  Administered 2021-12-25: 15 mL via OROMUCOSAL

## 2021-12-25 MED ORDER — ACETAMINOPHEN 500 MG PO TABS: 1000.0000 mg | ORAL_TABLET | Freq: Once | ORAL | Status: DC

## 2021-12-25 MED ORDER — FENTANYL CITRATE (PF) 250 MCG/5ML IJ SOLN
INTRAMUSCULAR | Status: DC | PRN
  Administered 2021-12-25: 50 ug via INTRAVENOUS
  Administered 2021-12-25: 25 ug via INTRAVENOUS

## 2021-12-25 MED ORDER — ONDANSETRON HCL 4 MG/2ML IJ SOLN
INTRAMUSCULAR | Status: AC
  Filled 2021-12-25: qty 2

## 2021-12-25 MED ORDER — BUPIVACAINE HCL (PF) 0.25 % IJ SOLN
INTRAMUSCULAR | Status: AC
  Filled 2021-12-25: qty 30

## 2021-12-25 MED ORDER — PROPOFOL 10 MG/ML IV BOLUS
INTRAVENOUS | Status: AC
  Filled 2021-12-25: qty 20

## 2021-12-25 MED ORDER — FENTANYL CITRATE (PF) 250 MCG/5ML IJ SOLN
INTRAMUSCULAR | Status: AC
  Filled 2021-12-25: qty 5

## 2021-12-25 MED ORDER — PHENYLEPHRINE 40 MCG/ML (10ML) SYRINGE FOR IV PUSH (FOR BLOOD PRESSURE SUPPORT)
PREFILLED_SYRINGE | INTRAVENOUS | Status: DC | PRN
  Administered 2021-12-25: 40 ug via INTRAVENOUS
  Administered 2021-12-25 (×3): 80 ug via INTRAVENOUS
  Administered 2021-12-25: 40 ug via INTRAVENOUS
  Administered 2021-12-25 (×4): 80 ug via INTRAVENOUS

## 2021-12-25 MED ORDER — OXYCODONE HCL 5 MG PO TABS: 5.0000 mg | ORAL_TABLET | Freq: Once | ORAL | Status: DC | PRN

## 2021-12-25 MED ORDER — CEFAZOLIN SODIUM-DEXTROSE 2-3 GM-%(50ML) IV SOLR
INTRAVENOUS | Status: DC | PRN
  Administered 2021-12-25: 2 g via INTRAVENOUS

## 2021-12-25 MED ORDER — PROMETHAZINE HCL 25 MG/ML IJ SOLN: 6.2500 mg | INTRAMUSCULAR | Status: DC | PRN

## 2021-12-25 MED ORDER — TRAMADOL HCL 50 MG PO TABS: 100.0000 mg | ORAL_TABLET | Freq: Four times a day (QID) | ORAL | 0 refills | Status: DC | PRN

## 2021-12-25 MED ORDER — AMISULPRIDE (ANTIEMETIC) 5 MG/2ML IV SOLN: 10.0000 mg | Freq: Once | INTRAVENOUS | Status: DC | PRN

## 2021-12-25 MED ORDER — FENTANYL CITRATE (PF) 100 MCG/2ML IJ SOLN: 25.0000 ug | INTRAMUSCULAR | Status: DC | PRN

## 2021-12-25 MED ORDER — CHLORHEXIDINE GLUCONATE 0.12 % MT SOLN: 15.0000 mL | Freq: Once | OROMUCOSAL | Status: AC

## 2021-12-25 MED ORDER — ONDANSETRON HCL 4 MG/2ML IJ SOLN
INTRAMUSCULAR | Status: DC | PRN
  Administered 2021-12-25: 4 mg via INTRAVENOUS

## 2021-12-25 MED ORDER — LACTATED RINGERS IV SOLN: INTRAVENOUS | Status: DC

## 2021-12-25 MED ORDER — LIDOCAINE 2% (20 MG/ML) 5 ML SYRINGE
INTRAMUSCULAR | Status: AC
  Filled 2021-12-25: qty 5

## 2021-12-25 MED ORDER — 0.9 % SODIUM CHLORIDE (POUR BTL) OPTIME
TOPICAL | Status: DC | PRN
  Administered 2021-12-25: 1000 mL

## 2021-12-25 MED ORDER — HEPARIN 6000 UNIT IRRIGATION SOLUTION
Status: AC
  Filled 2021-12-25: qty 500

## 2021-12-25 MED ORDER — MIDAZOLAM HCL 2 MG/2ML IJ SOLN
INTRAMUSCULAR | Status: DC | PRN
Start: 2021-12-25 — End: 2021-12-25
  Administered 2021-12-25: 2 mg via INTRAVENOUS

## 2021-12-25 MED ORDER — ACETAMINOPHEN 500 MG PO TABS
ORAL_TABLET | ORAL | Status: AC
  Filled 2021-12-25: qty 2

## 2021-12-25 MED ORDER — LIDOCAINE 2% (20 MG/ML) 5 ML SYRINGE
INTRAMUSCULAR | Status: DC | PRN
Start: 2021-12-25 — End: 2021-12-25
  Administered 2021-12-25: 80 mg via INTRAVENOUS

## 2021-12-25 MED ORDER — OXYCODONE HCL 5 MG/5ML PO SOLN: 5.0000 mg | Freq: Once | ORAL | Status: DC | PRN

## 2021-12-25 MED ORDER — MIDAZOLAM HCL 2 MG/2ML IJ SOLN
INTRAMUSCULAR | Status: AC
  Filled 2021-12-25: qty 2

## 2021-12-25 MED ORDER — SCOPOLAMINE 1 MG/3DAYS TD PT72
MEDICATED_PATCH | TRANSDERMAL | Status: AC
  Administered 2021-12-25: 1.5 mg via TRANSDERMAL
  Filled 2021-12-25: qty 1

## 2021-12-25 MED ORDER — PHENYLEPHRINE 40 MCG/ML (10ML) SYRINGE FOR IV PUSH (FOR BLOOD PRESSURE SUPPORT)
PREFILLED_SYRINGE | INTRAVENOUS | Status: AC
  Filled 2021-12-25: qty 20

## 2021-12-25 MED ORDER — SCOPOLAMINE 1 MG/3DAYS TD PT72: 1.0000 | MEDICATED_PATCH | TRANSDERMAL | Status: DC

## 2021-12-25 MED ORDER — BUPIVACAINE HCL 0.25 % IJ SOLN
INTRAMUSCULAR | Status: DC | PRN
  Administered 2021-12-25: 9 mL

## 2021-12-25 MED ORDER — DEXAMETHASONE SODIUM PHOSPHATE 10 MG/ML IJ SOLN
INTRAMUSCULAR | Status: DC | PRN
Start: 2021-12-25 — End: 2021-12-25
  Administered 2021-12-25: 10 mg via INTRAVENOUS

## 2021-12-25 SURGICAL SUPPLY — 41 items
BAG COUNTER SPONGE SURGICOUNT (BAG) ×2 IMPLANT
BAG DECANTER FOR FLEXI CONT (MISCELLANEOUS) ×2 IMPLANT
CHLORAPREP W/TINT 10.5 ML (MISCELLANEOUS) ×1 IMPLANT
COVER SURGICAL LIGHT HANDLE (MISCELLANEOUS) ×2 IMPLANT
COVER TRANSDUCER ULTRASND GEL (DISPOSABLE) ×2 IMPLANT
DECANTER SPIKE VIAL GLASS SM (MISCELLANEOUS) ×1 IMPLANT
DERMABOND ADVANCED (GAUZE/BANDAGES/DRESSINGS) ×1
DERMABOND ADVANCED .7 DNX12 (GAUZE/BANDAGES/DRESSINGS) ×1 IMPLANT
DRAPE C-ARM 42X120 X-RAY (DRAPES) ×2 IMPLANT
DRAPE CHEST BREAST 15X10 FENES (DRAPES) ×1 IMPLANT
ELECT CAUTERY BLADE 6.4 (BLADE) ×2 IMPLANT
ELECT REM PT RETURN 9FT ADLT (ELECTROSURGICAL) ×2
ELECTRODE REM PT RTRN 9FT ADLT (ELECTROSURGICAL) ×1 IMPLANT
GAUZE 4X4 16PLY ~~LOC~~+RFID DBL (SPONGE) ×2 IMPLANT
GEL ULTRASOUND 20GR AQUASONIC (MISCELLANEOUS) ×2 IMPLANT
GLOVE SURG ENC MOIS LTX SZ7 (GLOVE) ×2 IMPLANT
GLOVE SURG UNDER POLY LF SZ7.5 (GLOVE) ×2 IMPLANT
GOWN STRL REUS W/ TWL LRG LVL3 (GOWN DISPOSABLE) ×2 IMPLANT
GOWN STRL REUS W/TWL LRG LVL3 (GOWN DISPOSABLE) ×2
INTRODUCER COOK 11FR (CATHETERS) IMPLANT
KIT BASIN OR (CUSTOM PROCEDURE TRAY) ×2 IMPLANT
KIT PORT POWER 8FR ISP CVUE (Port) ×2 IMPLANT
KIT TURNOVER KIT B (KITS) ×2 IMPLANT
NS IRRIG 1000ML POUR BTL (IV SOLUTION) ×2 IMPLANT
PAD ARMBOARD 7.5X6 YLW CONV (MISCELLANEOUS) ×4 IMPLANT
PENCIL BUTTON HOLSTER BLD 10FT (ELECTRODE) ×2 IMPLANT
POSITIONER HEAD DONUT 9IN (MISCELLANEOUS) ×2 IMPLANT
SET INTRODUCER 12FR PACEMAKER (INTRODUCER) IMPLANT
SET SHEATH INTRODUCER 10FR (MISCELLANEOUS) IMPLANT
SHEATH COOK PEEL AWAY SET 9F (SHEATH) IMPLANT
STRIP CLOSURE SKIN 1/2X4 (GAUZE/BANDAGES/DRESSINGS) ×1 IMPLANT
SUT MNCRL AB 4-0 PS2 18 (SUTURE) ×2 IMPLANT
SUT PROLENE 2 0 SH DA (SUTURE) ×2 IMPLANT
SUT SILK 2 0 (SUTURE)
SUT SILK 2-0 18XBRD TIE 12 (SUTURE) IMPLANT
SUT VIC AB 3-0 SH 27 (SUTURE) ×1
SUT VIC AB 3-0 SH 27XBRD (SUTURE) ×1 IMPLANT
SYR 5ML LUER SLIP (SYRINGE) ×2 IMPLANT
TOWEL GREEN STERILE (TOWEL DISPOSABLE) ×2 IMPLANT
TOWEL GREEN STERILE FF (TOWEL DISPOSABLE) ×2 IMPLANT
TRAY LAPAROSCOPIC MC (CUSTOM PROCEDURE TRAY) ×2 IMPLANT

## 2021-12-25 NOTE — Anesthesia Preprocedure Evaluation (Signed)
Anesthesia Evaluation  Patient identified by MRN, date of birth, ID band Patient awake    Reviewed: Allergy & Precautions, NPO status , Patient's Chart, lab work & pertinent test results  Airway Mallampati: II  TM Distance: >3 FB Neck ROM: Full    Dental no notable dental hx.    Pulmonary neg pulmonary ROS,    Pulmonary exam normal breath sounds clear to auscultation       Cardiovascular negative cardio ROS Normal cardiovascular exam Rhythm:Regular Rate:Normal     Neuro/Psych PSYCHIATRIC DISORDERS Anxiety negative neurological ROS     GI/Hepatic Neg liver ROS, GERD  ,  Endo/Other  negative endocrine ROS  Renal/GU negative Renal ROS  negative genitourinary   Musculoskeletal negative musculoskeletal ROS (+)   Abdominal   Peds negative pediatric ROS (+)  Hematology negative hematology ROS (+)   Anesthesia Other Findings Breast cancer  Reproductive/Obstetrics negative OB ROS                             Anesthesia Physical Anesthesia Plan  ASA: 3  Anesthesia Plan: General   Post-op Pain Management:    Induction: Intravenous  PONV Risk Score and Plan: 2 and Treatment may vary due to age or medical condition, Midazolam, Scopolamine patch - Pre-op, Ondansetron and Dexamethasone  Airway Management Planned: LMA  Additional Equipment: None  Intra-op Plan:   Post-operative Plan: Extubation in OR  Informed Consent: I have reviewed the patients History and Physical, chart, labs and discussed the procedure including the risks, benefits and alternatives for the proposed anesthesia with the patient or authorized representative who has indicated his/her understanding and acceptance.     Dental advisory given  Plan Discussed with: CRNA, Anesthesiologist and Surgeon  Anesthesia Plan Comments:         Anesthesia Quick Evaluation

## 2021-12-25 NOTE — Discharge Instructions (Signed)
PORT-A-CATH: POST OP INSTRUCTIONS  Always review your discharge instruction sheet given to you by the facility where your surgery was performed.   A prescription for pain medication may be given to you upon discharge. Take your pain medication as prescribed, if needed. If narcotic pain medicine is not needed, then you make take acetaminophen (Tylenol) or ibuprofen (Advil) as needed.  Take your usually prescribed medications unless otherwise directed. If you need a refill on your pain medication, please contact our office. All narcotic pain medicine now requires a paper prescription.  Phoned in and fax refills are no longer allowed by law.  Prescriptions will not be filled after 5 pm or on weekends.  You should follow a light diet for the remainder of the day after your procedure. Most patients will experience some mild swelling and/or bruising in the area of the incision. It may take several days to resolve. It is common to experience some constipation if taking pain medication after surgery. Increasing fluid intake and taking a stool softener (such as Colace) will usually help or prevent this problem from occurring. A mild laxative (Milk of Magnesia or Miralax) should be taken according to package directions if there are no bowel movements after 48 hours.  Unless discharge instructions indicate otherwise, you may remove your bandages 48 hours after surgery, and you may shower at that time. You may have steri-strips (small white skin tapes) in place directly over the incision.  These strips should be left on the skin for 7-10 days.  If your surgeon used Dermabond (skin glue) on the incision, you may shower in 24 hours.  The glue will flake off over the next 2-3 weeks.  If your port is left accessed at the end of surgery (needle left in port), the dressing cannot get wet and should only by changed by a healthcare professional. When the port is no longer accessed (when the needle has been removed), follow  step 7.   ACTIVITIES:  Limit activity involving your arms for the next 72 hours. Do no strenuous exercise or activity for 1 week. You may drive when you are no longer taking prescription pain medication, you can comfortably wear a seatbelt, and you can maneuver your car. 10.You may need to see your doctor in the office for a follow-up appointment.  Please       check with your doctor.  11.When you receive a new Port-a-Cath, you will get a product guide and        ID card.  Please keep them in case you need them.  WHEN TO CALL YOUR DOCTOR ([phone]): Fever over 101.0 Chills Continued bleeding from incision Increased redness and tenderness at the site Shortness of breath, difficulty breathing   The clinic staff is available to answer your questions during regular business hours. Please don't hesitate to call and ask to speak to one of the nurses or medical assistants for clinical concerns. If you have a medical emergency, go to the nearest emergency room or call 911.  A surgeon from [facility] is always on call at the hospital.     For further information, please visit [email or web address]      

## 2021-12-25 NOTE — Anesthesia Postprocedure Evaluation (Signed)
Anesthesia Post Note  Patient: Crystal Lane  Procedure(s) Performed: INSERTION PORT-A-CATH (Right: Chest)     Patient location during evaluation: PACU Anesthesia Type: General Level of consciousness: sedated Pain management: pain level controlled Vital Signs Assessment: post-procedure vital signs reviewed and stable Respiratory status: spontaneous breathing and respiratory function stable Cardiovascular status: stable Postop Assessment: no apparent nausea or vomiting Anesthetic complications: no   No notable events documented.  Last Vitals:  Vitals:   12/25/21 0922 12/25/21 0929  BP: 118/68   Pulse: 75 79  Resp: 14 14  Temp:  36.7 C  SpO2: 100% 100%    Last Pain:  Vitals:   12/25/21 0837  TempSrc:   PainSc: 0-No pain                 Merlinda Frederick

## 2021-12-25 NOTE — Anesthesia Procedure Notes (Signed)
Procedure Name: LMA Insertion Date/Time: 12/25/2021 7:50 AM Performed by: Dorann Lodge, CRNA Pre-anesthesia Checklist: Patient identified, Emergency Drugs available, Suction available and Patient being monitored Patient Re-evaluated:Patient Re-evaluated prior to induction Oxygen Delivery Method: Circle System Utilized Preoxygenation: Pre-oxygenation with 100% oxygen Induction Type: IV induction Ventilation: Mask ventilation without difficulty LMA: LMA inserted LMA Size: 4.0 Number of attempts: 1 Airway Equipment and Method: Bite block Placement Confirmation: positive ETCO2 Tube secured with: Tape Dental Injury: Teeth and Oropharynx as per pre-operative assessment

## 2021-12-25 NOTE — Interval H&P Note (Signed)
History and Physical Interval Note:  12/25/2021 7:36 AM  Dorene M Mckercher  has presented today for surgery, with the diagnosis of BREAST CANCER.  The various methods of treatment have been discussed with the patient and family. After consideration of risks, benefits and other options for treatment, the patient has consented to  Procedure(s): INSERTION PORT-A-CATH (N/A) as a surgical intervention.  The patient's history has been reviewed, patient examined, no change in status, stable for surgery.  I have reviewed the patient's chart and labs.  Questions were answered to the patient's satisfaction.     Rolm Bookbinder

## 2021-12-25 NOTE — Transfer of Care (Signed)
Immediate Anesthesia Transfer of Care Note  Patient: Crystal Lane  Procedure(s) Performed: INSERTION PORT-A-CATH (Right: Chest)  Patient Location: PACU  Anesthesia Type:General  Level of Consciousness: awake and drowsy  Airway & Oxygen Therapy: Patient Spontanous Breathing  Post-op Assessment: Report given to RN and Post -op Vital signs reviewed and stable  Post vital signs: Reviewed and stable  Last Vitals:  Vitals Value Taken Time  BP 96/51 12/25/21 0837  Temp    Pulse 82 12/25/21 0838  Resp 19 12/25/21 0838  SpO2 97 % 12/25/21 0838  Vitals shown include unvalidated device data.  Last Pain:  Vitals:   12/25/21 0652  TempSrc:   PainSc: 0-No pain      Patients Stated Pain Goal: 1 (28/83/37 4451)  Complications: No notable events documented.

## 2021-12-25 NOTE — Op Note (Signed)
Preoperative diagnosis: recurrent left breast cancer Postoperative diagnosis: Same as above Procedure: Right internal jugular port placement Surgeon: Dr. Serita Grammes Anesthesia: General Estimated blood loss: Minimal Specimens: None Drains: None Complications: None Special count was correct completion Disposition recovery stable edition  Indications: 46 year old female who in 2012 underwent a lumpectomy and sentinel lymph node biopsy with a right subclavian port placement for a triple negative cancer. This was followed by chemotherapy and radiation. She was noted to have a BRCA1 mutation at that time. She recently noticed a left upper outer quadrant breast mass. This is causing her pain. She has no nipple discharge. Ultrasound shows a 5.2 x 4.2 x 3.5 cm mass. Axillary ultrasound is negative. 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 We discussed port placement.   Procedure: After informed consent was obtained she was taken to the operating.  She was given antibiotics.  SCDs were in place.  She was placed under general anesthesia without complication.  She was prepped and draped in a standard sterile surgical fashion.  Surgical timeout was then performed.  I then identified her right internal jugular vein with the ultrasound.  I made a small nick in the skin.  I then accessed this on the first pass with the needle.  The wire was placed. This was confirmed to be in the vein by the ultrasound as well as by fluoroscopy.  I then placed local into the area below her clavicle where the pocket was made.  I made incision and developed a pocket.  I then tunneled the line between the 2 sites.  The dilator was then placed over the wire.  This was done using fluoroscopy.  The wire was then removed.  The line was placed through the sheath and the sheath removed.  I pulled the line back to be in the distal cava near  the cavoatrial junction.  This was a good position upon completion.  I then hooked the port up to the line.  I sutured this position with 2-0 Prolene suture.  I aspirated blood and flushed easily.  I placed heparin in the port.  I then got a final completion film that showed this all to be in good position.  I then closed with 3-0 Vicryl and 4-0 Monocryl.  Glue was placed.  She tolerated this well and was transferred to recovery.

## 2021-12-26 ENCOUNTER — Encounter (HOSPITAL_COMMUNITY): Payer: Self-pay | Admitting: General Surgery

## 2021-12-30 MED FILL — Dexamethasone Sodium Phosphate Inj 100 MG/10ML: INTRAMUSCULAR | Qty: 1 | Status: AC

## 2021-12-30 NOTE — Progress Notes (Signed)
Patient Care Team: Pcp, No as PCP - General Verdell Carmine, MD as Consulting Physician (Internal Medicine) Donneta Romberg, MD (Inactive) as Consulting Physician (Obstetrics and Gynecology) Mauro Kaufmann, RN as Oncology Nurse Navigator  DIAGNOSIS:    ICD-10-CM   1. Malignant neoplasm of upper-outer quadrant of left breast in female, estrogen receptor positive ([place])  C50.412    Z17.0       SUMMARY OF ONCOLOGIC HISTORY: Oncology History  Malignant neoplasm of upper-outer quadrant of left breast in female, estrogen receptor positive ([place])  03/2010 Miscellaneous   BRCA 1 mutation detected at [facility] May 2011 ([id number]]   03/2011 - 08/2011 Neo-Adjuvant Chemotherapy   neoadjuvantly with 4 cycles of dose dense doxorubicin and cyclophosphamide followed by 3 of 12 planned doses of weekly paclitaxel, discontinued because of concerns regarding neuropathy   09/29/2011 Surgery   left lumpectomy and sentinel lymph node sampling 09/29/2011 for a residual ypT1c ypN0 invasive ductal carcinoma, grade 3   11/2011 - 12/2011 Radiation Therapy   adjuvant radiation    12/03/2021 Initial Diagnosis   Mammogram detected left breast mass measuring 5.2 cm at 12 o'clock position biopsy on 12/03/2021 showed recurrent grade 3 IDC ER+(40%/PR-/Her2-).  Ki-67 40%   12/31/2021 -  Chemotherapy   Patient is on Treatment Plan : BREAST TC q21d       CHIEF COMPLIANT: Cycle 1 TC  INTERVAL HISTORY: Crystal Lane is a 46 y.o. with above-mentioned history of left breast cancer, to start chemotherapy with TC. She presents to the clinic today for treatment.    ALLERGIES:  is allergic to lorazepam, oseltamivir, tomato, betadine [povidone iodine], chlorhexidine gluconate, and gelatin.  MEDICATIONS:  Current Outpatient Medications  Medication Sig Dispense Refill   dexamethasone (DECADRON) 4 MG tablet Take 1 tablet (4 mg total) by mouth daily. Take 1 tablet day before chemo and 1 tablet day after chemo  with food 30 tablet 1   lidocaine-prilocaine (EMLA) cream Apply to affected area once 30 g 3   LORazepam (ATIVAN) 0.5 MG tablet Take 1 tablet (0.5 mg total) by mouth every 6 (six) hours as needed (Nausea or vomiting). (Patient not taking: Reported on 12/16/2021) 30 tablet 0   naproxen (NAPROSYN) 500 MG tablet Take 1 tablet (500 mg total) by mouth 2 (two) times daily with a meal. 60 tablet 3   omeprazole (PRILOSEC) 20 MG capsule Take 1 capsule (20 mg total) by mouth daily. 30 capsule 6   ondansetron (ZOFRAN) 8 MG tablet Take 1 tablet (8 mg total) by mouth 2 (two) times daily as needed for refractory nausea / vomiting. Start on day 3 after chemo. 30 tablet 1   prochlorperazine (COMPAZINE) 10 MG tablet Take 1 tablet (10 mg total) by mouth every 6 (six) hours as needed for nausea or vomiting. 30 tablet 6   traMADol (ULTRAM) 50 MG tablet Take 2 tablets (100 mg total) by mouth every 6 (six) hours as needed. 10 tablet 0   No current facility-administered medications for this visit.    PHYSICAL EXAMINATION: ECOG PERFORMANCE STATUS: 1 - Symptomatic but completely ambulatory  There were no vitals filed for this visit. There were no vitals filed for this visit.    LABORATORY DATA:  I have reviewed the data as listed CMP Latest Ref Rng & Units 12/10/2021 06/19/2016 09/24/2015  Glucose 70 - 99 mg/dL 162(H) 125 128  BUN 6 - 20 mg/dL 22(H) 16.4 17.4  Creatinine 0.44 - 1.00 mg/dL 0.65 0.8 0.8  Sodium 135 -  145 mmol/L 135 138 137  Potassium 3.5 - 5.1 mmol/L 4.4 4.1 4.2  Chloride 98 - 111 mmol/L 100 - -  CO2 22 - 32 mmol/L 27 23 24   Calcium 8.9 - 10.3 mg/dL 9.7 9.1 8.9  Total Protein 6.5 - 8.1 g/dL 7.4 7.1 6.9  Total Bilirubin 0.3 - 1.2 mg/dL 0.1(L) 0.39 0.30  Alkaline Phos 38 - 126 U/L 80 92 80  AST 15 - 41 U/L 15 13 12   ALT 0 - 44 U/L 13 16 14     Lab Results  Component Value Date   WBC 8.6 12/10/2021   HGB 13.8 12/10/2021   HCT 41.7 12/10/2021   MCV 87.8 12/10/2021   PLT 319 12/10/2021    NEUTROABS 6.5 12/10/2021    ASSESSMENT & PLAN:  Malignant neoplasm of upper-outer quadrant of left breast in female, estrogen receptor positive ([place]) 12/03/2021: Mammogram detected left breast mass measuring 5.2 cm at 12 o'clock position biopsy on 12/03/2021 showed recurrent grade 3 IDC ER+(40%/PR-/Her2-).  Ki-67 40% (BRCA 1 mutation positive: Prior history: [place] 2012: 4.3 cm triple negative breast cancer status post NAC with AC-T followed by lumpectomy, radiation) This is a functional triple negative breast cancer  Treatment plan: 1.  Neoadjuvant chemotherapy with Taxotere and Cytoxan Pembrolizumab every 3 weeks x4 followed by maintenance immunotherapy for 1 year 2. bilateral mastectomies with reconstruction 3.  Adjuvant antiestrogen therapy 4.  Consideration for adjuvant olaparib based upon BRCA1 gene mutation 5.  CT CAP and bone scan will be ordered for staging ---------------------------------------------------------------------------------------------------------------- Current Treatment: Cycle 1 Taxotere and Cytoxan (Pembro on hold until insurance approves it) Labs reviewed Chemo education completed, chemo consent obtained  RTC in 1 week for tox check    No orders of the defined types were placed in this encounter.  The patient has a good understanding of the overall plan. she agrees with it. she will call with any problems that may develop before the next visit here.  Total time spent: 30 mins including face to face time and time spent for planning, charting and coordination of care  Rulon Eisenmenger, MD, MPH 12/31/2021  I, Thana Ates, am acting as scribe for Dr. Nicholas Lose.  I have reviewed the above documentation for accuracy and completeness, and I agree with the above.

## 2021-12-30 NOTE — Assessment & Plan Note (Signed)
12/03/2021: Mammogram detected left breast mass measuring 5.2 cm at 12 o'clock position biopsy on 12/03/2021 showed recurrent grade 3 IDC ER+(40%/PR-/Her2-).  Ki-67 40% (BRCA 1 mutation positive: Prior history: Crystal Lane 2012: 4.3 cm triple negative breast cancer status post NAC with AC-T followed by lumpectomy, radiation) This is a functional triple negative breast cancer  Treatment plan: 1.  Neoadjuvant chemotherapy with Taxotere and Cytoxan Pembrolizumab every 3 weeks x4 followed by maintenance immunotherapy for 1 year 2. bilateral mastectomies with reconstruction 3.  Adjuvant antiestrogen therapy 4.  Consideration for adjuvant olaparib based upon BRCA1 gene mutation 5.  CT CAP and bone scan will be ordered for staging ---------------------------------------------------------------------------------------------------------------- Current Treatment: Cycle 1 Taxotere and Cytoxan (Pembro on hold until insurance approves it) Labs reviewed Chemo education completed, chemo consent obtained  RTC in 1 week for tox check

## 2021-12-31 ENCOUNTER — Inpatient Hospital Stay (HOSPITAL_BASED_OUTPATIENT_CLINIC_OR_DEPARTMENT_OTHER): Payer: BC Managed Care – PPO | Admitting: Hematology and Oncology

## 2021-12-31 ENCOUNTER — Inpatient Hospital Stay: Payer: BC Managed Care – PPO | Attending: Hematology and Oncology

## 2021-12-31 ENCOUNTER — Other Ambulatory Visit: Payer: Self-pay

## 2021-12-31 ENCOUNTER — Inpatient Hospital Stay: Payer: BC Managed Care – PPO

## 2021-12-31 ENCOUNTER — Encounter: Payer: Self-pay | Admitting: *Deleted

## 2021-12-31 ENCOUNTER — Encounter: Payer: Self-pay | Admitting: Licensed Clinical Social Worker

## 2021-12-31 VITALS — BP 148/93 | HR 75 | Temp 98.8°F | Resp 16

## 2021-12-31 DIAGNOSIS — Z9221 Personal history of antineoplastic chemotherapy: Secondary | ICD-10-CM | POA: Diagnosis not present

## 2021-12-31 DIAGNOSIS — Z170 Estrogen receptor positive status [ER+]: Secondary | ICD-10-CM | POA: Diagnosis not present

## 2021-12-31 DIAGNOSIS — C50412 Malignant neoplasm of upper-outer quadrant of left female breast: Secondary | ICD-10-CM | POA: Insufficient documentation

## 2021-12-31 DIAGNOSIS — Z5189 Encounter for other specified aftercare: Secondary | ICD-10-CM | POA: Insufficient documentation

## 2021-12-31 DIAGNOSIS — Z79899 Other long term (current) drug therapy: Secondary | ICD-10-CM | POA: Diagnosis not present

## 2021-12-31 DIAGNOSIS — Z791 Long term (current) use of non-steroidal anti-inflammatories (NSAID): Secondary | ICD-10-CM | POA: Insufficient documentation

## 2021-12-31 DIAGNOSIS — Z9013 Acquired absence of bilateral breasts and nipples: Secondary | ICD-10-CM | POA: Diagnosis not present

## 2021-12-31 DIAGNOSIS — Z923 Personal history of irradiation: Secondary | ICD-10-CM | POA: Insufficient documentation

## 2021-12-31 DIAGNOSIS — Z5111 Encounter for antineoplastic chemotherapy: Secondary | ICD-10-CM | POA: Insufficient documentation

## 2021-12-31 DIAGNOSIS — Z7952 Long term (current) use of systemic steroids: Secondary | ICD-10-CM | POA: Diagnosis not present

## 2021-12-31 DIAGNOSIS — Z95828 Presence of other vascular implants and grafts: Secondary | ICD-10-CM

## 2021-12-31 LAB — CBC WITH DIFFERENTIAL (CANCER CENTER ONLY)
Abs Immature Granulocytes: 0.06 10*3/uL (ref 0.00–0.07)
Basophils Absolute: 0 10*3/uL (ref 0.0–0.1)
Basophils Relative: 0 %
Eosinophils Absolute: 0.1 10*3/uL (ref 0.0–0.5)
Eosinophils Relative: 0 %
HCT: 39 % (ref 36.0–46.0)
Hemoglobin: 13.1 g/dL (ref 12.0–15.0)
Immature Granulocytes: 0 %
Lymphocytes Relative: 12 %
Lymphs Abs: 1.6 10*3/uL (ref 0.7–4.0)
MCH: 29.4 pg (ref 26.0–34.0)
MCHC: 33.6 g/dL (ref 30.0–36.0)
MCV: 87.6 fL (ref 80.0–100.0)
Monocytes Absolute: 0.7 10*3/uL (ref 0.1–1.0)
Monocytes Relative: 5 %
Neutro Abs: 11.4 10*3/uL — ABNORMAL HIGH (ref 1.7–7.7)
Neutrophils Relative %: 83 %
Platelet Count: 279 10*3/uL (ref 150–400)
RBC: 4.45 MIL/uL (ref 3.87–5.11)
RDW: 13.2 % (ref 11.5–15.5)
WBC Count: 13.9 10*3/uL — ABNORMAL HIGH (ref 4.0–10.5)
nRBC: 0 % (ref 0.0–0.2)

## 2021-12-31 LAB — CMP (CANCER CENTER ONLY)
ALT: 15 U/L (ref 0–44)
AST: 13 U/L — ABNORMAL LOW (ref 15–41)
Albumin: 3.8 g/dL (ref 3.5–5.0)
Alkaline Phosphatase: 80 U/L (ref 38–126)
Anion gap: 8 (ref 5–15)
BUN: 24 mg/dL — ABNORMAL HIGH (ref 6–20)
CO2: 23 mmol/L (ref 22–32)
Calcium: 8.4 mg/dL — ABNORMAL LOW (ref 8.9–10.3)
Chloride: 101 mmol/L (ref 98–111)
Creatinine: 0.73 mg/dL (ref 0.44–1.00)
GFR, Estimated: >60 mL/min (ref >60)
Glucose, Bld: 193 mg/dL — ABNORMAL HIGH (ref 70–99)
Potassium: 3.5 mmol/L (ref 3.5–5.1)
Sodium: 132 mmol/L — ABNORMAL LOW (ref 135–145)
Total Bilirubin: 0.2 mg/dL — ABNORMAL LOW (ref 0.3–1.2)
Total Protein: 7.3 g/dL (ref 6.5–8.1)

## 2021-12-31 LAB — PREGNANCY, URINE: Preg Test, Ur: NEGATIVE

## 2021-12-31 MED ORDER — SODIUM CHLORIDE 0.9 % IV SOLN
600.0000 mg/m2 | Freq: Once | INTRAVENOUS | Status: AC
  Administered 2021-12-31: 1180 mg via INTRAVENOUS
  Filled 2021-12-31: qty 59

## 2021-12-31 MED ORDER — SODIUM CHLORIDE 0.9 % IV SOLN
65.0000 mg/m2 | Freq: Once | INTRAVENOUS | Status: AC
  Administered 2021-12-31: 130 mg via INTRAVENOUS
  Filled 2021-12-31: qty 13

## 2021-12-31 MED ORDER — SODIUM CHLORIDE 0.9 % IV SOLN
10.0000 mg | Freq: Once | INTRAVENOUS | Status: AC
  Administered 2021-12-31: 10 mg via INTRAVENOUS
  Filled 2021-12-31: qty 10

## 2021-12-31 MED ORDER — SODIUM CHLORIDE 0.9% FLUSH
10.0000 mL | INTRAVENOUS | Status: AC | PRN
  Administered 2021-12-31: 10 mL

## 2021-12-31 MED ORDER — SODIUM CHLORIDE 0.9% FLUSH
10.0000 mL | INTRAVENOUS | Status: DC | PRN
  Administered 2021-12-31: 10 mL

## 2021-12-31 MED ORDER — HEPARIN SOD (PORK) LOCK FLUSH 100 UNIT/ML IV SOLN
500.0000 [IU] | Freq: Once | INTRAVENOUS | Status: AC | PRN
  Administered 2021-12-31: 500 [IU]

## 2021-12-31 MED ORDER — PALONOSETRON HCL INJECTION 0.25 MG/5ML
0.2500 mg | Freq: Once | INTRAVENOUS | Status: AC
  Administered 2021-12-31: 0.25 mg via INTRAVENOUS
  Filled 2021-12-31: qty 5

## 2021-12-31 MED ORDER — SODIUM CHLORIDE 0.9 % IV SOLN: Freq: Once | INTRAVENOUS | Status: AC

## 2021-12-31 NOTE — Progress Notes (Signed)
Patient using Dignic[employer]. Cryotherapy applied to feet for duration of Taxotere. Cryotherapy refused on hands. Patient states she has to work during treatment and is unable to ice fingers. Risks of neuropathy reinforced, patient declined.  Patient requests a window seat each treatment due to "severe panic attacks". Patient reports she must always be able to see to the outside in order to remain calm. Unable to seat patient at a window seat today, but she is in an open section with a window and has been able to tolerate.

## 2021-12-31 NOTE — Progress Notes (Signed)
[facility] CSW Progress Note ° °Clinical Social Worker met with patient in infusion during first treatment to provide coping support. Patient reports to be doing well so far today and is maintaining hope and humor. She reports having had a panic attack after a long day seeing the surgeon, plastic surgeon, and pre-op but has been coping well since. Pt has strong support from her fiance and mom as well as co-workers, including one who also had a bilateral mastectomy last year. CSW provided active listening and will continue to check-in with patient during treatment. ° ° ° °Michelle E Zavala , LCSW °

## 2021-12-31 NOTE — Patient Instructions (Addendum)
[facility]   Discharge Instructions: Thank you for choosing [facility] to provide your oncology and hematology care.   If you have a lab appointment with the [facility], please go directly to the [facility] and check in at the registration area.   Wear comfortable clothing and clothing appropriate for easy access to any Portacath or PICC line.   We strive to give you quality time with your provider. You may need to reschedule your appointment if you arrive late (15 or more minutes).  Arriving late affects you and other patients whose appointments are after yours.  Also, if you miss three or more appointments without notifying the office, you may be dismissed from the clinic at the providers discretion.      For prescription refill requests, have your pharmacy contact our office and allow 72 hours for refills to be completed.    Today you received the following chemotherapy and/or immunotherapy agents: docetaxel and cyclophosphamide      To help prevent nausea and vomiting after your treatment, we encourage you to take your nausea medication as directed.  BELOW ARE SYMPTOMS THAT SHOULD BE REPORTED IMMEDIATELY: *FEVER GREATER THAN 100.4 F (38 C) OR HIGHER *CHILLS OR SWEATING *NAUSEA AND VOMITING THAT IS NOT CONTROLLED WITH YOUR NAUSEA MEDICATION *UNUSUAL SHORTNESS OF BREATH *UNUSUAL BRUISING OR BLEEDING *URINARY PROBLEMS (pain or burning when urinating, or frequent urination) *BOWEL PROBLEMS (unusual diarrhea, constipation, pain near the anus) TENDERNESS IN MOUTH AND THROAT WITH OR WITHOUT PRESENCE OF ULCERS (sore throat, sores in mouth, or a toothache) UNUSUAL RASH, SWELLING OR PAIN  UNUSUAL VAGINAL DISCHARGE OR ITCHING   Items with * indicate a potential emergency and should be followed up as soon as possible or go to the Emergency Department if any problems should occur.  Please show the CHEMOTHERAPY ALERT CARD or IMMUNOTHERAPY  ALERT CARD at check-in to the Emergency Department and triage nurse.  Should you have questions after your visit or need to cancel or reschedule your appointment, please contact [facility]  Dept: [phone]  and follow the prompts.  Office hours are 8:00 a.m. to 4:30 p.m. Monday - Friday. Please note that voicemails left after 4:00 p.m. may not be returned until the following business day.  We are closed weekends and major holidays. You have access to a nurse at all times for urgent questions. Please call the main number to the clinic Dept: [phone] and follow the prompts.   For any non-urgent questions, you may also contact your provider using MyChart. We now offer e-Visits for anyone 71 and older to request care online for non-urgent symptoms. For details visit [email or web address].   Also download the MyChart app! Go to the app store, search "MyChart", open the app, select , and log in with your MyChart username and password.  Due to Covid, a mask is required upon entering the hospital/clinic. If you do not have a mask, one will be given to you upon arrival. For doctor visits, patients may have 1 support person aged 23 or older with them. For treatment visits, patients cannot have anyone with them due to current Covid guidelines and our immunocompromised population.    Docetaxel injection What is this medication? DOCETAXEL (doe se TAX el) is a chemotherapy drug. It targets fast dividing cells, like cancer cells, and causes these cells to die. This medicine is used to treat many types of cancers like breast cancer, certain stomach cancers,  head and neck cancer, lung cancer, and prostate cancer. This medicine may be used for other purposes; ask your health care provider or pharmacist if you have questions. COMMON BRAND NAME(S): Docefrez, Taxotere What should I tell my care team before I take this medication? They need to know if you have any of  these conditions: infection (especially a virus infection such as chickenpox, cold sores, or herpes) liver disease low blood counts, like low white cell, platelet, or red cell counts an unusual or allergic reaction to docetaxel, polysorbate 80, other chemotherapy agents, other medicines, foods, dyes, or preservatives pregnant or trying to get pregnant breast-feeding How should I use this medication? This drug is given as an infusion into a vein. It is administered in a hospital or clinic by a specially trained health care professional. Talk to your pediatrician regarding the use of this medicine in children. Special care may be needed. Overdosage: If you think you have taken too much of this medicine contact a poison control center or emergency room at once. NOTE: This medicine is only for you. Do not share this medicine with others. What if I miss a dose? It is important not to miss your dose. Call your doctor or health care professional if you are unable to keep an appointment. What may interact with this medication? Do not take this medicine with any of the following medications: live virus vaccines This medicine may also interact with the following medications: aprepitant certain antibiotics like erythromycin or clarithromycin certain antivirals for HIV or hepatitis certain medicines for fungal infections like fluconazole, itraconazole, ketoconazole, posaconazole, or voriconazole cimetidine ciprofloxacin conivaptan cyclosporine dronedarone fluvoxamine grapefruit juice imatinib verapamil This list may not describe all possible interactions. Give your health care provider a list of all the medicines, herbs, non-prescription drugs, or dietary supplements you use. Also tell them if you smoke, drink alcohol, or use illegal drugs. Some items may interact with your medicine. What should I watch for while using this medication? Your condition will be monitored carefully while you are  receiving this medicine. You will need important blood work done while you are taking this medicine. Call your doctor or health care professional for advice if you get a fever, chills or sore throat, or other symptoms of a cold or flu. Do not treat yourself. This drug decreases your body's ability to fight infections. Try to avoid being around people who are sick. Some products may contain alcohol. Ask your health care professional if this medicine contains alcohol. Be sure to tell all health care professionals you are taking this medicine. Certain medicines, like metronidazole and disulfiram, can cause an unpleasant reaction when taken with alcohol. The reaction includes flushing, headache, nausea, vomiting, sweating, and increased thirst. The reaction can last from 30 minutes to several hours. You may get drowsy or dizzy. Do not drive, use machinery, or do anything that needs mental alertness until you know how this medicine affects you. Do not stand or sit up quickly, especially if you are an older patient. This reduces the risk of dizzy or fainting spells. Alcohol may interfere with the effect of this medicine. Talk to your health care professional about your risk of cancer. You may be more at risk for certain types of cancer if you take this medicine. Do not become pregnant while taking this medicine or for 6 months after stopping it. Women should inform their doctor if they wish to become pregnant or think they might be pregnant. There is a potential for  serious side effects to an unborn child. Talk to your health care professional or pharmacist for more information. Do not breast-feed an infant while taking this medicine or for 1 week after stopping it. Males who get this medicine must use a condom during sex with females who can get pregnant. If you get a woman pregnant, the baby could have birth defects. The baby could die before they are born. You will need to continue wearing a condom for 3 months  after stopping the medicine. Tell your health care provider right away if your partner becomes pregnant while you are taking this medicine. This may interfere with the ability to father a child. You should talk to your doctor or health care professional if you are concerned about your fertility. What side effects may I notice from receiving this medication? Side effects that you should report to your doctor or health care professional as soon as possible: allergic reactions like skin rash, itching or hives, swelling of the face, lips, or tongue blurred vision breathing problems changes in vision low blood counts - This drug may decrease the number of white blood cells, red blood cells and platelets. You may be at increased risk for infections and bleeding. nausea and vomiting pain, redness or irritation at site where injected pain, tingling, numbness in the hands or feet redness, blistering, peeling, or loosening of the skin, including inside the mouth signs of decreased platelets or bleeding - bruising, pinpoint red spots on the skin, black, tarry stools, nosebleeds signs of decreased red blood cells - unusually weak or tired, fainting spells, lightheadedness signs of infection - fever or chills, cough, sore throat, pain or difficulty passing urine swelling of the ankle, feet, hands Side effects that usually do not require medical attention (report to your doctor or health care professional if they continue or are bothersome): constipation diarrhea fingernail or toenail changes hair loss loss of appetite mouth sores muscle pain This list may not describe all possible side effects. Call your doctor for medical advice about side effects. You may report side effects to FDA at 1-800-FDA-1088. Where should I keep my medication? This drug is given in a hospital or clinic and will not be stored at home. NOTE: This sheet is a summary. It may not cover all possible information. If you have  questions about this medicine, talk to your doctor, pharmacist, or health care provider.  2022 Elsevier/Gold Standard (2021-07-22 00:00:00)  Cyclophosphamide Injection What is this medication? CYCLOPHOSPHAMIDE (sye kloe FOSS fa mide) is a chemotherapy drug. It slows the growth of cancer cells. This medicine is used to treat many types of cancer like lymphoma, myeloma, leukemia, breast cancer, and ovarian cancer, to name a few. This medicine may be used for other purposes; ask your health care provider or pharmacist if you have questions. COMMON BRAND NAME(S): Cytoxan, Neosar What should I tell my care team before I take this medication? They need to know if you have any of these conditions: heart disease history of irregular heartbeat infection kidney disease liver disease low blood counts, like white cells, platelets, or red blood cells on hemodialysis recent or ongoing radiation therapy scarring or thickening of the lungs trouble passing urine an unusual or allergic reaction to cyclophosphamide, other medicines, foods, dyes, or preservatives pregnant or trying to get pregnant breast-feeding How should I use this medication? This drug is usually given as an injection into a vein or muscle or by infusion into a vein. It is administered in a hospital  or clinic by a specially trained health care professional. Talk to your pediatrician regarding the use of this medicine in children. Special care may be needed. Overdosage: If you think you have taken too much of this medicine contact a poison control center or emergency room at once. NOTE: This medicine is only for you. Do not share this medicine with others. What if I miss a dose? It is important not to miss your dose. Call your doctor or health care professional if you are unable to keep an appointment. What may interact with this medication? amphotericin B azathioprine certain antivirals for HIV or hepatitis certain medicines for  blood pressure, heart disease, irregular heart beat certain medicines that treat or prevent blood clots like warfarin certain other medicines for cancer cyclosporine etanercept indomethacin medicines that relax muscles for surgery medicines to increase blood counts metronidazole This list may not describe all possible interactions. Give your health care provider a list of all the medicines, herbs, non-prescription drugs, or dietary supplements you use. Also tell them if you smoke, drink alcohol, or use illegal drugs. Some items may interact with your medicine. What should I watch for while using this medication? Your condition will be monitored carefully while you are receiving this medicine. You may need blood work done while you are taking this medicine. Drink water or other fluids as directed. Urinate often, even at night. Some products may contain alcohol. Ask your health care professional if this medicine contains alcohol. Be sure to tell all health care professionals you are taking this medicine. Certain medicines, like metronidazole and disulfiram, can cause an unpleasant reaction when taken with alcohol. The reaction includes flushing, headache, nausea, vomiting, sweating, and increased thirst. The reaction can last from 30 minutes to several hours. Do not become pregnant while taking this medicine or for 1 year after stopping it. Women should inform their health care professional if they wish to become pregnant or think they might be pregnant. Men should not father a child while taking this medicine and for 4 months after stopping it. There is potential for serious side effects to an unborn child. Talk to your health care professional for more information. Do not breast-feed an infant while taking this medicine or for 1 week after stopping it. This medicine has caused ovarian failure in some women. This medicine may make it more difficult to get pregnant. Talk to your health care  professional if you are concerned about your fertility. This medicine has caused decreased sperm counts in some men. This may make it more difficult to father a child. Talk to your health care professional if you are concerned about your fertility. Call your health care professional for advice if you get a fever, chills, or sore throat, or other symptoms of a cold or flu. Do not treat yourself. This medicine decreases your body's ability to fight infections. Try to avoid being around people who are sick. Avoid taking medicines that contain aspirin, acetaminophen, ibuprofen, naproxen, or ketoprofen unless instructed by your health care professional. These medicines may hide a fever. Talk to your health care professional about your risk of cancer. You may be more at risk for certain types of cancer if you take this medicine. If you are going to need surgery or other procedure, tell your health care professional that you are using this medicine. Be careful brushing or flossing your teeth or using a toothpick because you may get an infection or bleed more easily. If you have any dental work  done, tell your dentist you are receiving this medicine. What side effects may I notice from receiving this medication? Side effects that you should report to your doctor or health care professional as soon as possible: allergic reactions like skin rash, itching or hives, swelling of the face, lips, or tongue breathing problems nausea, vomiting signs and symptoms of bleeding such as bloody or black, tarry stools; red or dark brown urine; spitting up blood or brown material that looks like coffee grounds; red spots on the skin; unusual bruising or bleeding from the eyes, gums, or nose signs and symptoms of heart failure like fast, irregular heartbeat, sudden weight gain; swelling of the ankles, feet, hands signs and symptoms of infection like fever; chills; cough; sore throat; pain or trouble passing urine signs and  symptoms of kidney injury like trouble passing urine or change in the amount of urine signs and symptoms of liver injury like dark yellow or brown urine; general ill feeling or flu-like symptoms; light-colored stools; loss of appetite; nausea; right upper belly pain; unusually weak or tired; yellowing of the eyes or skin Side effects that usually do not require medical attention (report to your doctor or health care professional if they continue or are bothersome): confusion decreased hearing diarrhea facial flushing hair loss headache loss of appetite missed menstrual periods signs and symptoms of low red blood cells or anemia such as unusually weak or tired; feeling faint or lightheaded; falls skin discoloration This list may not describe all possible side effects. Call your doctor for medical advice about side effects. You may report side effects to FDA at 1-800-FDA-1088. Where should I keep my medication? This drug is given in a hospital or clinic and will not be stored at home. NOTE: This sheet is a summary. It may not cover all possible information. If you have questions about this medicine, talk to your doctor, pharmacist, or health care provider.  2022 Elsevier/Gold Standard (2021-07-22 00:00:00)

## 2022-01-02 ENCOUNTER — Inpatient Hospital Stay: Payer: BC Managed Care – PPO

## 2022-01-02 ENCOUNTER — Other Ambulatory Visit: Payer: Self-pay

## 2022-01-02 VITALS — BP 139/76 | HR 79 | Temp 98.9°F | Resp 18

## 2022-01-02 DIAGNOSIS — Z170 Estrogen receptor positive status [ER+]: Secondary | ICD-10-CM

## 2022-01-02 DIAGNOSIS — C50412 Malignant neoplasm of upper-outer quadrant of left female breast: Secondary | ICD-10-CM | POA: Diagnosis not present

## 2022-01-02 MED ORDER — PEGFILGRASTIM-CBQV 6 MG/0.6ML ~~LOC~~ SOSY
6.0000 mg | PREFILLED_SYRINGE | Freq: Once | SUBCUTANEOUS | Status: AC
  Administered 2022-01-02: 6 mg via SUBCUTANEOUS
  Filled 2022-01-02: qty 0.6

## 2022-01-06 NOTE — Progress Notes (Signed)
Patient Care Team: Pcp, No as PCP - General Verdell Carmine, MD as Consulting Physician (Internal Medicine) Donneta Romberg, MD (Inactive) as Consulting Physician (Obstetrics and Gynecology) Mauro Kaufmann, RN as Oncology Nurse Navigator  DIAGNOSIS:    ICD-10-CM   1. 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]   03/2011 - 08/2011 Neo-Adjuvant Chemotherapy   neoadjuvantly with 4 cycles of dose dense doxorubicin and cyclophosphamide followed by 3 of 12 planned doses of weekly paclitaxel, discontinued because of concerns regarding neuropathy   09/29/2011 Surgery   left lumpectomy and sentinel lymph node sampling 09/29/2011 for a residual ypT1c ypN0 invasive ductal carcinoma, grade 3   11/2011 - 12/2011 Radiation Therapy   adjuvant radiation    12/03/2021 Initial Diagnosis   Mammogram detected left breast mass measuring 5.2 cm at 12 o'clock position biopsy on 12/03/2021 showed recurrent grade 3 IDC ER+(40%/PR-/Her2-).  Ki-67 40%   12/31/2021 -  Chemotherapy   Patient is on Treatment Plan : BREAST TC q21d       CHIEF COMPLIANT: Follow-up of left breast cancer  INTERVAL HISTORY: Crystal Lane is a 46 y.o. with above-mentioned history of left breast cancer, currently on chemotherapy with TC. She presents to the clinic today for toxicity check and follow-up.   ALLERGIES:  is allergic to lorazepam, oseltamivir, tomato, betadine [povidone iodine], chlorhexidine gluconate, and gelatin.  MEDICATIONS:  Current Outpatient Medications  Medication Sig Dispense Refill   dexamethasone (DECADRON) 4 MG tablet Take 1 tablet (4 mg total) by mouth daily. Take 1 tablet day  before chemo and 1 tablet day after chemo with food 30 tablet 1   lidocaine-prilocaine (EMLA) cream Apply to affected area once 30 g 3   naproxen (NAPROSYN) 500 MG tablet Take 1 tablet (500 mg total) by mouth 2 (two) times daily with a meal. 60 tablet 3   omeprazole (PRILOSEC) 20 MG capsule Take 1 capsule (20 mg total) by mouth daily. 30 capsule 6   ondansetron (ZOFRAN) 8 MG tablet Take 1 tablet (8 mg total) by mouth 2 (two) times daily as needed for refractory nausea / vomiting. Start on day 3 after chemo. 30 tablet 1   prochlorperazine (COMPAZINE) 10 MG tablet Take 1 tablet (10 mg total) by mouth every 6 (six) hours as needed for nausea or vomiting. 30 tablet 6   No current facility-administered medications for this visit.    PHYSICAL EXAMINATION: ECOG PERFORMANCE STATUS: 1 - Symptomatic but completely ambulatory  Vitals:   01/07/22 1000  BP: (!) 168/87  Pulse: (!) 108  Resp: 19  Temp: 97.9 F (36.6 C)  SpO2: 98%   Filed Weights   01/07/22 1000  Weight: 199 lb (90.3 kg)    BREAST: No palpable masses or nodules in either right or left breasts. No palpable axillary supraclavicular or infraclavicular adenopathy no breast tenderness or nipple discharge. (exam performed in the presence of a chaperone)  LABORATORY DATA:  I have reviewed the data as listed CMP Latest Ref Rng & Units 12/31/2021 12/10/2021 06/19/2016  Glucose 70 - 99 mg/dL 193(H) 162(H) 125  BUN 6 - 20 mg/dL 24(H) 22(H) 16.4  Creatinine 0.44 - 1.00 mg/dL 0.73 0.65 0.8  Sodium 135 - 145 mmol/L 132(L) 135 138  Potassium 3.5 - 5.1 mmol/L 3.5 4.4 4.1  Chloride 98 - 111 mmol/L 101 100 -  CO2 22 - 32 mmol/L 23 27 23   Calcium 8.9 - 10.3 mg/dL 8.4(L) 9.7 9.1  Total Protein 6.5 - 8.1 g/dL 7.3 7.4 7.1  Total Bilirubin 0.3 - 1.2 mg/dL 0.2(L) 0.1(L) 0.39  Alkaline Phos 38 - 126 U/L 80 80 92  AST 15 - 41 U/L 13(L) 15 13  ALT 0 - 44 U/L 15 13 16     Lab Results  Component Value Date   WBC 19.9 (H) 01/07/2022   HGB 12.4  01/07/2022   HCT 37.9 01/07/2022   MCV 87.5 01/07/2022   PLT 195 01/07/2022   NEUTROABS PENDING 01/07/2022    ASSESSMENT & PLAN:  Malignant neoplasm of upper-outer quadrant of left breast in female, estrogen receptor positive ([place]) 12/03/2021: Mammogram detected left breast mass measuring 5.2 cm at 12 o'clock position biopsy on 12/03/2021 showed recurrent grade 3 IDC ER+(40%/PR-/Her2-).  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  Neoadjuvant chemotherapy with Taxotere and Cytoxan Pembrolizumab every 3 weeks x4 followed by maintenance immunotherapy for 1 year 2. bilateral mastectomies with reconstruction 3.  Adjuvant antiestrogen therapy 4.  Consideration for adjuvant olaparib based upon BRCA1 gene mutation  CT CAP and bone scan: No evidence of distant metastatic disease ---------------------------------------------------------------------------------------------------------------- Current Treatment: Cycle 1 day 8 Taxotere and Cytoxan (Pembro on hold until insurance approves it) Labs reviewed  Chemo Toxicities: Bone pain from Neulasta: She took naproxen and Claritin which helped 2. she was in tears today because her mother had restricted her movement severely and prevented her family from even meeting her and therefore she felt imprisoned at her house and wanted [place] to address her mother's concerns.  RTC in 2 weeks for cycle 2     No orders of the defined types were placed in this encounter.  The patient has a good understanding of the overall plan. she agrees with it. she will call with any problems that may develop before the next visit here.  Total time spent: 20 mins including face to face time and time spent for planning, charting and coordination of care  Rulon Eisenmenger, MD,  [facility] 01/07/2022  I, Thana Ates, am acting as scribe for Dr. Nicholas Lose.  I have reviewed the above documentation for accuracy and completeness, and I agree with the above.

## 2022-01-06 NOTE — Assessment & Plan Note (Signed)
12/03/2021: Mammogram detected left breast mass measuring 5.2 cm at 12 o'clock position biopsy on 12/03/2021 showed recurrent grade 3 IDC ER+(40%/PR-/Her2-).  Ki-67 40% °(BRCA 1 mutation positive: Prior history: [place] 2012: 4.3 cm triple negative breast cancer status post NAC with AC-T followed by lumpectomy, radiation) °This is a functional triple negative breast cancer °  °Treatment plan: °1.  Neoadjuvant chemotherapy with Taxotere and Cytoxan Pembrolizumab every 3 weeks x4 followed by maintenance immunotherapy for 1 year °2. bilateral mastectomies with reconstruction °3.  Adjuvant antiestrogen therapy °4.  Consideration for adjuvant olaparib based upon BRCA1 gene mutation °5.  CT CAP and bone scan will be ordered for staging °---------------------------------------------------------------------------------------------------------------- °Current Treatment: Cycle 1 day 8 Taxotere and Cytoxan (Pembro on hold until insurance approves it) °Labs reviewed °Chemo Toxicities: ° °RTC in 2 weeks for cycle 2  °

## 2022-01-07 ENCOUNTER — Inpatient Hospital Stay: Payer: BC Managed Care – PPO

## 2022-01-07 ENCOUNTER — Encounter: Payer: Self-pay | Admitting: *Deleted

## 2022-01-07 ENCOUNTER — Other Ambulatory Visit: Payer: Self-pay

## 2022-01-07 ENCOUNTER — Inpatient Hospital Stay (HOSPITAL_BASED_OUTPATIENT_CLINIC_OR_DEPARTMENT_OTHER): Payer: BC Managed Care – PPO | Admitting: Hematology and Oncology

## 2022-01-07 DIAGNOSIS — Z170 Estrogen receptor positive status [ER+]: Secondary | ICD-10-CM | POA: Diagnosis not present

## 2022-01-07 DIAGNOSIS — C50412 Malignant neoplasm of upper-outer quadrant of left female breast: Secondary | ICD-10-CM | POA: Diagnosis not present

## 2022-01-07 DIAGNOSIS — Z95828 Presence of other vascular implants and grafts: Secondary | ICD-10-CM

## 2022-01-07 LAB — CMP (CANCER CENTER ONLY)
ALT: 18 U/L (ref 0–44)
AST: 18 U/L (ref 15–41)
Albumin: 3.8 g/dL (ref 3.5–5.0)
Alkaline Phosphatase: 106 U/L (ref 38–126)
Anion gap: 8 (ref 5–15)
BUN: 11 mg/dL (ref 6–20)
CO2: 27 mmol/L (ref 22–32)
Calcium: 8.9 mg/dL (ref 8.9–10.3)
Chloride: 101 mmol/L (ref 98–111)
Creatinine: 0.6 mg/dL (ref 0.44–1.00)
GFR, Estimated: >60 mL/min (ref >60)
Glucose, Bld: 193 mg/dL — ABNORMAL HIGH (ref 70–99)
Potassium: 4.1 mmol/L (ref 3.5–5.1)
Sodium: 136 mmol/L (ref 135–145)
Total Bilirubin: 0.4 mg/dL (ref 0.3–1.2)
Total Protein: 6.5 g/dL (ref 6.5–8.1)

## 2022-01-07 LAB — CBC WITH DIFFERENTIAL (CANCER CENTER ONLY)
Abs Immature Granulocytes: 2.01 10*3/uL — ABNORMAL HIGH (ref 0.00–0.07)
Basophils Absolute: 0 10*3/uL (ref 0.0–0.1)
Basophils Relative: 0 %
Eosinophils Absolute: 0.1 10*3/uL (ref 0.0–0.5)
Eosinophils Relative: 1 %
HCT: 37.9 % (ref 36.0–46.0)
Hemoglobin: 12.4 g/dL (ref 12.0–15.0)
Immature Granulocytes: 10 %
Lymphocytes Relative: 9 %
Lymphs Abs: 1.7 10*3/uL (ref 0.7–4.0)
MCH: 28.6 pg (ref 26.0–34.0)
MCHC: 32.7 g/dL (ref 30.0–36.0)
MCV: 87.5 fL (ref 80.0–100.0)
Monocytes Absolute: 3.2 10*3/uL — ABNORMAL HIGH (ref 0.1–1.0)
Monocytes Relative: 16 %
Neutro Abs: 12.9 10*3/uL — ABNORMAL HIGH (ref 1.7–7.7)
Neutrophils Relative %: 64 %
Platelet Count: 195 10*3/uL (ref 150–400)
RBC: 4.33 MIL/uL (ref 3.87–5.11)
RDW: 13.2 % (ref 11.5–15.5)
WBC Count: 19.9 10*3/uL — ABNORMAL HIGH (ref 4.0–10.5)
nRBC: 0.4 % — ABNORMAL HIGH (ref 0.0–0.2)

## 2022-01-07 LAB — PREGNANCY, URINE: Preg Test, Ur: NEGATIVE

## 2022-01-07 MED ORDER — HEPARIN SOD (PORK) LOCK FLUSH 100 UNIT/ML IV SOLN
500.0000 [IU] | INTRAVENOUS | Status: AC | PRN
  Administered 2022-01-07: 500 [IU]

## 2022-01-07 MED ORDER — SODIUM CHLORIDE 0.9% FLUSH
10.0000 mL | INTRAVENOUS | Status: AC | PRN
  Administered 2022-01-07: 10 mL

## 2022-01-08 ENCOUNTER — Telehealth: Payer: Self-pay | Admitting: *Deleted

## 2022-01-08 ENCOUNTER — Other Ambulatory Visit: Payer: Self-pay | Admitting: *Deleted

## 2022-01-08 DIAGNOSIS — C50412 Malignant neoplasm of upper-outer quadrant of left female breast: Secondary | ICD-10-CM

## 2022-01-08 NOTE — Telephone Encounter (Signed)
Spoke to [employer] regarding referral for DIEP flap reconstructions after chemo. Referral placed and sent to Amy at [phone]

## 2022-01-20 MED FILL — Dexamethasone Sodium Phosphate Inj 100 MG/10ML: INTRAMUSCULAR | Qty: 1 | Status: AC

## 2022-01-20 NOTE — Progress Notes (Signed)
? ?Patient Care Team: ?Pcp, No as PCP - General ?Verdell Carmine, MD as Consulting Physician (Internal Medicine) ?Donneta Romberg, MD (Inactive) as Consulting Physician (Obstetrics and Gynecology) ?Mauro Kaufmann, RN as Oncology Nurse Navigator ? ?DIAGNOSIS:  ?  ICD-10-CM   ?1. Malignant neoplasm of upper-outer quadrant of left breast in female, estrogen receptor positive ([place])  C50.412   ? Z17.0   ?  ? ? ?SUMMARY OF ONCOLOGIC HISTORY: ?Oncology History  ?Malignant neoplasm of upper-outer quadrant of left breast in female, estrogen receptor positive ([place])  ?03/2010 Miscellaneous  ? BRCA 1 mutation detected at [facility] May 2011 (IVS5--11T>G] ?  ?03/2011 - 08/2011 Neo-Adjuvant Chemotherapy  ? neoadjuvantly with 4 cycles of dose dense doxorubicin and cyclophosphamide followed by 3 of 12 planned doses of weekly paclitaxel, discontinued because of concerns regarding neuropathy ?  ?09/29/2011 Surgery  ? left lumpectomy and sentinel lymph node sampling 09/29/2011 for a residual ypT1c ypN0 invasive ductal carcinoma, grade 3 ?  ?11/2011 - 12/2011 Radiation Therapy  ? adjuvant radiation  ?  ?12/03/2021 Initial Diagnosis  ? Mammogram detected left breast mass measuring 5.2 cm at 12 o'clock position biopsy on 12/03/2021 showed recurrent grade 3 IDC ER+(40%/PR-/Her2-).  Ki-67 40% ?  ?12/31/2021 -  Chemotherapy  ? Patient is on Treatment Plan : BREAST TC q21d  ?   ? ? ?CHIEF COMPLIANT: Cycle 2 TC ? ?INTERVAL HISTORY: Crystal Lane is a 46 y.o. with above-mentioned history of left breast cancer, currently on chemotherapy with TC. She presents to the clinic today for treatment.  Her major complaints today are related to severe anxiety and lack of sleep.  The anxiety is related to losing hair in spite of using dignicap.  She she bought a wig as well ? ?ALLERGIES:  is allergic to lorazepam, oseltamivir, tomato, betadine [povidone iodine], chlorhexidine gluconate, and gelatin. ? ?MEDICATIONS:  ?Current Outpatient Medications   ?Medication Sig Dispense Refill  ? dexamethasone (DECADRON) 4 MG tablet Take 1 tablet (4 mg total) by mouth daily. Take 1 tablet day before chemo and 1 tablet day after chemo with food 30 tablet 1  ? lidocaine-prilocaine (EMLA) cream Apply to affected area once 30 g 3  ? naproxen (NAPROSYN) 500 MG tablet Take 1 tablet (500 mg total) by mouth 2 (two) times daily with a meal. 60 tablet 3  ? omeprazole (PRILOSEC) 20 MG capsule Take 1 capsule (20 mg total) by mouth daily. 30 capsule 6  ? ondansetron (ZOFRAN) 8 MG tablet Take 1 tablet (8 mg total) by mouth 2 (two) times daily as needed for refractory nausea / vomiting. Start on day 3 after chemo. 30 tablet 1  ? prochlorperazine (COMPAZINE) 10 MG tablet Take 1 tablet (10 mg total) by mouth every 6 (six) hours as needed for nausea or vomiting. 30 tablet 6  ? ?No current facility-administered medications for this visit.  ? ? ?PHYSICAL EXAMINATION: ?ECOG PERFORMANCE STATUS: 1 - Symptomatic but completely ambulatory ? ?Vitals:  ? 01/21/22 0947  ?BP: (!) 153/86  ?Pulse: 90  ?Resp: 18  ?Temp: 97.9 ?F (36.6 ?C)  ?SpO2: 99%  ? ?Filed Weights  ? 01/21/22 0947  ?Weight: 195 lb 14.4 oz (88.9 kg)  ? ? ?LABORATORY DATA:  ?I have reviewed the data as listed ?CMP Latest Ref Rng & Units 01/07/2022 12/31/2021 12/10/2021  ?Glucose 70 - 99 mg/dL 193(H) 193(H) 162(H)  ?BUN 6 - 20 mg/dL 11 24(H) 22(H)  ?Creatinine 0.44 - 1.00 mg/dL 0.60 0.73 0.65  ?Sodium 135 -  145 mmol/L 136 132(L) 135  ?Potassium 3.5 - 5.1 mmol/L 4.1 3.5 4.4  ?Chloride 98 - 111 mmol/L 101 101 100  ?CO2 22 - 32 mmol/L _0 ?Calcium 8.9 - 10.3 mg/dL 8.9 8.4(L) 9.7  ?Total Protein 6.5 - 8.1 g/dL 6.5 7.3 7.4  ?Total Bilirubin 0.3 - 1.2 mg/dL 0.4 0.2(L) 0.1(L)  ?Alkaline Phos 38 - 126 U/L 106 80 80  ?AST 15 - 41 U/L 18 13(L) 15  ?ALT 0 - 44 U/L _1 ? ? ?Lab Results  ?Component Value Date  ? WBC 12.6 (H) 01/21/2022  ? HGB 11.8 (L) 01/21/2022  ? HCT 35.7 (L) 01/21/2022  ? MCV 87.1 01/21/2022  ? PLT 420 (H) 01/21/2022  ?  NEUTROABS 10.1 (H) 01/21/2022  ? ? ?ASSESSMENT & PLAN:  ?Malignant neoplasm of upper-outer quadrant of left breast in female, estrogen receptor positive ([place]) ?12/03/2021: Mammogram detected left breast mass measuring 5.2 cm at 12 o'clock position biopsy on 12/03/2021 showed recurrent grade 3 IDC ER+(40%/PR-/Her2-).  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  ?Treatment plan: ?1.  Neoadjuvant chemotherapy with Taxotere and Cytoxan Pembrolizumab every 3 weeks x4 followed by maintenance immunotherapy for 1 year ?2. bilateral mastectomies with reconstruction ?3.  Adjuvant antiestrogen therapy ?4.  Consideration for adjuvant olaparib based upon BRCA1 gene mutation ? CT CAP and bone scan: No evidence of distant metastatic disease ?---------------------------------------------------------------------------------------------------------------- ?Current Treatment: Cycle2Taxotere and Cytoxan (Pembro) insurance refused): Did not allow [place] to get a free drug) ?Labs reviewed ?  ?Chemo Toxicities: ?Bone pain from Neulasta: She took naproxen and Claritin which helped ?2. severe anxiety and lack of sleep: Sent a prescription for trazodone. ?  ?RTC in 3 weeks for cycle 3  ? ? ?No orders of the defined types were placed in this encounter. ? ?The patient has a good understanding of the overall plan. she agrees with it. she will call with any problems that may develop before the next visit here. ? ?Total time spent: 30 mins including face to face time and time spent for planning, charting and coordination of care ? ?Rulon Eisenmenger, MD, MPH ?01/21/2022 ? ?I, Thana Ates, am acting as scribe for Dr. Nicholas Lose. ? ?I have reviewed the above documentation for accuracy and completeness, and I agree with the above. ? ? ? ? ? ? ?

## 2022-01-21 ENCOUNTER — Inpatient Hospital Stay: Payer: BC Managed Care – PPO | Admitting: Licensed Clinical Social Worker

## 2022-01-21 ENCOUNTER — Inpatient Hospital Stay: Payer: BC Managed Care – PPO | Attending: Hematology and Oncology

## 2022-01-21 ENCOUNTER — Encounter: Payer: Self-pay | Admitting: *Deleted

## 2022-01-21 ENCOUNTER — Other Ambulatory Visit: Payer: Self-pay

## 2022-01-21 ENCOUNTER — Inpatient Hospital Stay: Payer: BC Managed Care – PPO

## 2022-01-21 ENCOUNTER — Inpatient Hospital Stay (HOSPITAL_BASED_OUTPATIENT_CLINIC_OR_DEPARTMENT_OTHER): Payer: BC Managed Care – PPO | Admitting: Hematology and Oncology

## 2022-01-21 DIAGNOSIS — C50412 Malignant neoplasm of upper-outer quadrant of left female breast: Secondary | ICD-10-CM

## 2022-01-21 DIAGNOSIS — Z170 Estrogen receptor positive status [ER+]: Secondary | ICD-10-CM

## 2022-01-21 DIAGNOSIS — G4730 Sleep apnea, unspecified: Secondary | ICD-10-CM | POA: Insufficient documentation

## 2022-01-21 DIAGNOSIS — Z923 Personal history of irradiation: Secondary | ICD-10-CM | POA: Insufficient documentation

## 2022-01-21 DIAGNOSIS — Z79899 Other long term (current) drug therapy: Secondary | ICD-10-CM | POA: Insufficient documentation

## 2022-01-21 DIAGNOSIS — Z5111 Encounter for antineoplastic chemotherapy: Secondary | ICD-10-CM | POA: Insufficient documentation

## 2022-01-21 DIAGNOSIS — Z79811 Long term (current) use of aromatase inhibitors: Secondary | ICD-10-CM | POA: Insufficient documentation

## 2022-01-21 DIAGNOSIS — F419 Anxiety disorder, unspecified: Secondary | ICD-10-CM | POA: Diagnosis not present

## 2022-01-21 DIAGNOSIS — Z9221 Personal history of antineoplastic chemotherapy: Secondary | ICD-10-CM | POA: Diagnosis not present

## 2022-01-21 DIAGNOSIS — Z9013 Acquired absence of bilateral breasts and nipples: Secondary | ICD-10-CM | POA: Diagnosis not present

## 2022-01-21 DIAGNOSIS — Z95828 Presence of other vascular implants and grafts: Secondary | ICD-10-CM

## 2022-01-21 LAB — CMP (CANCER CENTER ONLY)
ALT: 20 U/L (ref 0–44)
AST: 11 U/L — ABNORMAL LOW (ref 15–41)
Albumin: 4.1 g/dL (ref 3.5–5.0)
Alkaline Phosphatase: 99 U/L (ref 38–126)
Anion gap: 7 (ref 5–15)
BUN: 18 mg/dL (ref 6–20)
CO2: 26 mmol/L (ref 22–32)
Calcium: 9.3 mg/dL (ref 8.9–10.3)
Chloride: 105 mmol/L (ref 98–111)
Creatinine: 0.59 mg/dL (ref 0.44–1.00)
GFR, Estimated: >60 mL/min (ref >60)
Glucose, Bld: 162 mg/dL — ABNORMAL HIGH (ref 70–99)
Potassium: 3.6 mmol/L (ref 3.5–5.1)
Sodium: 138 mmol/L (ref 135–145)
Total Bilirubin: 0.4 mg/dL (ref 0.3–1.2)
Total Protein: 6.7 g/dL (ref 6.5–8.1)

## 2022-01-21 LAB — CBC WITH DIFFERENTIAL (CANCER CENTER ONLY)
Abs Immature Granulocytes: 0.04 10*3/uL (ref 0.00–0.07)
Basophils Absolute: 0 10*3/uL (ref 0.0–0.1)
Basophils Relative: 0 %
Eosinophils Absolute: 0 10*3/uL (ref 0.0–0.5)
Eosinophils Relative: 0 %
HCT: 35.7 % — ABNORMAL LOW (ref 36.0–46.0)
Hemoglobin: 11.8 g/dL — ABNORMAL LOW (ref 12.0–15.0)
Immature Granulocytes: 0 %
Lymphocytes Relative: 13 %
Lymphs Abs: 1.6 10*3/uL (ref 0.7–4.0)
MCH: 28.8 pg (ref 26.0–34.0)
MCHC: 33.1 g/dL (ref 30.0–36.0)
MCV: 87.1 fL (ref 80.0–100.0)
Monocytes Absolute: 0.8 10*3/uL (ref 0.1–1.0)
Monocytes Relative: 6 %
Neutro Abs: 10.1 10*3/uL — ABNORMAL HIGH (ref 1.7–7.7)
Neutrophils Relative %: 81 %
Platelet Count: 420 10*3/uL — ABNORMAL HIGH (ref 150–400)
RBC: 4.1 MIL/uL (ref 3.87–5.11)
RDW: 13.5 % (ref 11.5–15.5)
WBC Count: 12.6 10*3/uL — ABNORMAL HIGH (ref 4.0–10.5)
nRBC: 0 % (ref 0.0–0.2)

## 2022-01-21 LAB — PREGNANCY, URINE: Preg Test, Ur: NEGATIVE

## 2022-01-21 MED ORDER — SODIUM CHLORIDE 0.9 % IV SOLN
65.0000 mg/m2 | Freq: Once | INTRAVENOUS | Status: AC
  Administered 2022-01-21: 130 mg via INTRAVENOUS
  Filled 2022-01-21: qty 13

## 2022-01-21 MED ORDER — SODIUM CHLORIDE 0.9 % IV SOLN
600.0000 mg/m2 | Freq: Once | INTRAVENOUS | Status: AC
  Administered 2022-01-21: 1180 mg via INTRAVENOUS
  Filled 2022-01-21: qty 59

## 2022-01-21 MED ORDER — TRAZODONE HCL 50 MG PO TABS: 50.0000 mg | ORAL_TABLET | Freq: Every evening | ORAL | 1 refills | Status: AC | PRN

## 2022-01-21 MED ORDER — SODIUM CHLORIDE 0.9 % IV SOLN: Freq: Once | INTRAVENOUS | Status: AC

## 2022-01-21 MED ORDER — HEPARIN SOD (PORK) LOCK FLUSH 100 UNIT/ML IV SOLN
500.0000 [IU] | Freq: Once | INTRAVENOUS | Status: AC | PRN
  Administered 2022-01-21: 500 [IU]

## 2022-01-21 MED ORDER — SODIUM CHLORIDE 0.9% FLUSH
10.0000 mL | INTRAVENOUS | Status: DC | PRN
  Administered 2022-01-21: 10 mL

## 2022-01-21 MED ORDER — SODIUM CHLORIDE 0.9% FLUSH
10.0000 mL | Freq: Once | INTRAVENOUS | Status: AC
  Administered 2022-01-21: 10 mL

## 2022-01-21 MED ORDER — SODIUM CHLORIDE 0.9 % IV SOLN
10.0000 mg | Freq: Once | INTRAVENOUS | Status: AC
  Administered 2022-01-21: 10 mg via INTRAVENOUS
  Filled 2022-01-21: qty 10

## 2022-01-21 MED ORDER — PALONOSETRON HCL INJECTION 0.25 MG/5ML
0.2500 mg | Freq: Once | INTRAVENOUS | Status: AC
  Administered 2022-01-21: 0.25 mg via INTRAVENOUS
  Filled 2022-01-21: qty 5

## 2022-01-21 NOTE — Progress Notes (Signed)
[place] CSW Progress Note ? ?Clinical Social Worker met with patient in infusion to provide supportive counseling. Patient has felt physically pretty well since first treatment. She has had increased anxiety due to hair thinning despite dignicap and with mom restricting visitors/activities. Pt has worked to resolve both concerns by purchasing a wig she likes and having medical team provide a list of what she can still do.  With increased anxiety, patient is also having disrupted sleep with vivid dreams and will be trying medication to assist as needed. ? ?CSW provided space for patient to process the above and validated feelings. Began discussion on grieving and coping with bilateral mastectomies. Pt would like further counseling on this after she meets with plastic surgeon from [facility]. ? ?Pt continues to have strong support from her fiance, family, and friends. She is utilizing reiki, humor, her dogs, weighted blankets, exercise, and being outside as some coping mechanisms. ? ?Next visit: during 3rd treatment. Pt welcome to call as needed prior to next visit. ? ? ? ?Christeen Douglas LCSW ?

## 2022-01-21 NOTE — Assessment & Plan Note (Signed)
12/03/2021:?Mammogram detected left breast mass measuring 5.2 cm at 12 o'clock position biopsy on 12/03/2021 showed recurrent grade 3 IDC ER+(40%/PR-/Her2-). ?Ki-67 40% ?(BRCA 1 mutation positive: Prior history: Crystal Lane 2012: 4.3 cm triple negative breast cancer status post NAC with?AC-T?followed by lumpectomy, radiation) ?This is a?functional triple negative breast cancer ?? ?Treatment plan: ?1.??Neoadjuvant chemotherapy with Taxotere and Cytoxan Pembrolizumab?every 3 weeks x4?followed by maintenance immunotherapy for 1 year ?2.?bilateral mastectomies?with reconstruction ?3.??Adjuvant antiestrogen therapy ?4.??Consideration for adjuvant olaparib based upon BRCA1 gene mutation ? CT CAP and bone scan: No evidence of distant metastatic disease ?---------------------------------------------------------------------------------------------------------------- ?Current Treatment: Cycle2Taxotere and Cytoxan (Pembro) insurance refused): Did not allow [place] to get a free drug) ?Labs reviewed ?? ?Chemo Toxicities: ?1. Bone pain from Neulasta: She took naproxen and Claritin which helped ?2. she was in tears today because her mother had restricted her movement severely and prevented her family from even meeting her and therefore she felt imprisoned at her house and wanted [place] to address her mother's concerns. ?? ?RTC in 3 weeks for cycle 3? ?

## 2022-01-21 NOTE — Patient Instructions (Signed)
[facility]COL[facility]  Discharge Instructions: Thank you for choosing [facility] to provide your oncology and hematology care.   If you have a lab appointment with the Cancer Center, please go directly to the Cancer Center and check in at the registration area.   Wear comfortable clothing and clothing appropriate for easy access to any Portacath or PICC line.   We strive to give you quality time with your provider. You may need to reschedule your appointment if you arrive late (15 or more minutes).  Arriving late affects you and other patients whose appointments are after yours.  Also, if you miss three or more appointments without notifying the office, you may be dismissed from the clinic at the provider's discretion.      For prescription refill requests, have your pharmacy contact our office and allow 72 hours for refills to be completed.    Today you received the following chemotherapy and/or immunotherapy agents: Docetaxel (Taxotere) and Cyclophosphamide (Cytoxan)   To help prevent nausea and vomiting after your treatment, we encourage you to take your nausea medication as directed.  BELOW ARE SYMPTOMS THAT SHOULD BE REPORTED IMMEDIATELY: *FEVER GREATER THAN 100.4 F (38 C) OR HIGHER *CHILLS OR SWEATING *NAUSEA AND VOMITING THAT IS NOT CONTROLLED WITH YOUR NAUSEA MEDICATION *UNUSUAL SHORTNESS OF BREATH *UNUSUAL BRUISING OR BLEEDING *URINARY PROBLEMS (pain or burning when urinating, or frequent urination) *BOWEL PROBLEMS (unusual diarrhea, constipation, pain near the anus) TENDERNESS IN MOUTH AND THROAT WITH OR WITHOUT PRESENCE OF ULCERS (sore throat, sores in mouth, or a toothache) UNUSUAL RASH, SWELLING OR PAIN  UNUSUAL VAGINAL DISCHARGE OR ITCHING   Items with * indicate a potential emergency and should be followed up as soon as possible or go to the Emergency Department if any problems should occur.  Please show the CHEMOTHERAPY ALERT CARD or  IMMUNOTHERAPY ALERT CARD at check-in to the Emergency Department and triage nurse.  Should you have questions after your visit or need to cancel or reschedule your appointment, please contact [facility]  Dept: [phone]  and follow the prompts.  Office hours are 8:00 a.m. to 4:30 p.m. Monday - Friday. Please note that voicemails left after 4:00 p.m. may not be returned until the following business day.  We are closed weekends and major holidays. You have access to a nurse at all times for urgent questions. Please call the main number to the clinic Dept: [phone] and follow the prompts.   For any non-urgent questions, you may also contact your provider using MyChart. We now offer e-Visits for anyone 18 and older to request care online for non-urgent symptoms. For details visit [email or web address].   Also download the MyChart app! Go to the app store, search "MyChart", open the app, select , and log in with your MyChart username and password.  Due to Covid, a mask is required upon entering the hospital/clinic. If you do not have a mask, one will be given to you upon arrival. For doctor visits, patients may have 1 support person aged 18 or older with them. For treatment visits, patients cannot have anyone with them due to current Covid guidelines and our immunocompromised population.  

## 2022-01-23 ENCOUNTER — Other Ambulatory Visit: Payer: Self-pay

## 2022-01-23 ENCOUNTER — Inpatient Hospital Stay: Payer: BC Managed Care – PPO

## 2022-01-23 VITALS — BP 154/74 | HR 93 | Temp 98.2°F | Resp 18

## 2022-01-23 DIAGNOSIS — Z170 Estrogen receptor positive status [ER+]: Secondary | ICD-10-CM

## 2022-01-23 DIAGNOSIS — C50412 Malignant neoplasm of upper-outer quadrant of left female breast: Secondary | ICD-10-CM | POA: Diagnosis not present

## 2022-01-23 MED ORDER — PEGFILGRASTIM-CBQV 6 MG/0.6ML ~~LOC~~ SOSY
6.0000 mg | PREFILLED_SYRINGE | Freq: Once | SUBCUTANEOUS | Status: AC
  Administered 2022-01-23: 6 mg via SUBCUTANEOUS
  Filled 2022-01-23: qty 0.6

## 2022-02-05 ENCOUNTER — Telehealth: Payer: Self-pay | Admitting: *Deleted

## 2022-02-05 NOTE — Telephone Encounter (Signed)
Referral sent to Dr. Trina Ao at [facility] for SGAP flap reconstruction. Received confirmation of receipt. ?

## 2022-02-06 ENCOUNTER — Other Ambulatory Visit: Payer: Self-pay

## 2022-02-06 ENCOUNTER — Inpatient Hospital Stay: Payer: BC Managed Care – PPO | Admitting: Licensed Clinical Social Worker

## 2022-02-06 DIAGNOSIS — C50412 Malignant neoplasm of upper-outer quadrant of left female breast: Secondary | ICD-10-CM

## 2022-02-06 NOTE — Progress Notes (Signed)
[place] CSW Progress Note ? ?Clinical Social Worker met with patient to provide supportive counseling around mastectomy and reconstruction process. Pt had consult at [facility] and is still dealing with less than ideal options for reconstruction. She will hopefully be speaking with a surgeon from [facility] soon regarding another option.  ? ?Pt is struggling with the uncertainty around this process. She would liek to know a plan and not have to wait 4-6 months for reconstruction due to the mental toll of seeing herself flat and scarred as it would be "like cancer winning". CSW and pt processed fears around being flat and how to cope with that. Discussed ways to find events to look forward to if there is a delay for reconstruction as well as options for aesthetics (ex: knitted knockers).  CSW normalized feelings and need for support. Pt is finding strong support from her fiance and was able to feel more relaxed when in [place] after her last treatment.  ? ?CSW will see pt in infusion during her next treatment. ? ? ? ?Christeen Douglas LCSW ?

## 2022-02-10 NOTE — Assessment & Plan Note (Signed)
12/03/2021:?Mammogram detected left breast mass measuring 5.2 cm at 12 o'clock position biopsy on 12/03/2021 showed recurrent grade 3 IDC ER+(40%/PR-/Her2-). ?Ki-67 40% ?(BRCA 1 mutation positive: Prior history: Crystal Lane 2012: 4.3 cm triple negative breast cancer status post NAC with?AC-T?followed by lumpectomy, radiation) ?This is a?functional triple negative breast cancer ?? ?Treatment plan: ?1.??Neoadjuvant chemotherapy with Taxotere and Cytoxan Pembrolizumab?every 3 weeks x4?followed by maintenance immunotherapy for 1 year ?2.?bilateral mastectomies?with reconstruction ?3.??Adjuvant antiestrogen therapy ?4.??Consideration for adjuvant olaparib based upon BRCA1 gene mutation ??CT CAP and bone scan: No evidence of distant metastatic disease ?---------------------------------------------------------------------------------------------------------------- ?Current Treatment: Cycle 3 Taxotere and Cytoxan (Pembro) insurance refused) ?Labs reviewed ?? ?Chemo Toxicities: ?1. Bone pain from Neulasta: She took naproxen and Claritin which helped ?2.?severe anxiety and lack of sleep: Sent a prescription for trazodone. ?? ?RTC in 3 weeks for cycle 4? ?

## 2022-02-10 NOTE — Progress Notes (Signed)
? ?Patient Care Team: ?Pcp, No as PCP - General ?Verdell Carmine, MD as Consulting Physician (Internal Medicine) ?Donneta Romberg, MD (Inactive) as Consulting Physician (Obstetrics and Gynecology) ?Mauro Kaufmann, RN as Oncology Nurse Navigator ? ?DIAGNOSIS:  ?Encounter Diagnosis  ?Name Primary?  ? Malignant neoplasm of upper-outer quadrant of left breast in female, estrogen receptor positive ([place])   ? ? ?SUMMARY OF ONCOLOGIC HISTORY: ?Oncology History  ?Malignant neoplasm of upper-outer quadrant of left breast in female, estrogen receptor positive ([place])  ?03/2010 Miscellaneous  ? BRCA 1 mutation detected at [facility] May 2011 (IVS5--11T>G] ?  ?03/2011 - 08/2011 Neo-Adjuvant Chemotherapy  ? neoadjuvantly with 4 cycles of dose dense doxorubicin and cyclophosphamide followed by 3 of 12 planned doses of weekly paclitaxel, discontinued because of concerns regarding neuropathy ?  ?09/29/2011 Surgery  ? left lumpectomy and sentinel lymph node sampling 09/29/2011 for a residual ypT1c ypN0 invasive ductal carcinoma, grade 3 ?  ?11/2011 - 12/2011 Radiation Therapy  ? adjuvant radiation  ?  ?12/03/2021 Initial Diagnosis  ? Mammogram detected left breast mass measuring 5.2 cm at 12 o'clock position biopsy on 12/03/2021 showed recurrent grade 3 IDC ER+(40%/PR-/Her2-).  Ki-67 40% ?  ?12/31/2021 -  Chemotherapy  ? Patient is on Treatment Plan : BREAST TC q21d  ?   ? ? ?CHIEF COMPLIANT:  Cycle 3 Taxotere and Cytoxan  ? ?INTERVAL HISTORY: Crystal Lane is a 46 y.o. with above-mentioned history of left breast cancer, currently on chemotherapy with TC. She presents to the clinic today for treatment. She complained of nausea and joint pain. She complained of no appetite and white thrush on tongue. She also complained of nose bleed. She also has hot flashes. ? ? ?ALLERGIES:  is allergic to lorazepam, oseltamivir, tomato, betadine [povidone iodine], chlorhexidine gluconate, and gelatin. ? ?MEDICATIONS:  ?Current Outpatient  Medications  ?Medication Sig Dispense Refill  ? nystatin (MYCOSTATIN) 100000 UNIT/ML suspension Take 5 mLs (500,000 Units total) by mouth 4 (four) times daily. 60 mL 1  ? dexamethasone (DECADRON) 4 MG tablet Take 1 tablet (4 mg total) by mouth daily. Take 1 tablet day before chemo and 1 tablet day after chemo with food 30 tablet 1  ? lidocaine-prilocaine (EMLA) cream Apply to affected area once 30 g 3  ? naproxen (NAPROSYN) 500 MG tablet Take 1 tablet (500 mg total) by mouth 2 (two) times daily with a meal. 60 tablet 3  ? omeprazole (PRILOSEC) 20 MG capsule Take 1 capsule (20 mg total) by mouth daily. 30 capsule 6  ? ondansetron (ZOFRAN) 8 MG tablet Take 1 tablet (8 mg total) by mouth 2 (two) times daily as needed for refractory nausea / vomiting. Start on day 3 after chemo. 30 tablet 1  ? prochlorperazine (COMPAZINE) 10 MG tablet Take 1 tablet (10 mg total) by mouth every 6 (six) hours as needed for nausea or vomiting. 30 tablet 6  ? traZODone (DESYREL) 50 MG tablet Take 1 tablet (50 mg total) by mouth at bedtime as needed for sleep. 30 tablet 1  ? ?No current facility-administered medications for this visit.  ? ? ?PHYSICAL EXAMINATION: ?ECOG PERFORMANCE STATUS: 1 - Symptomatic but completely ambulatory ? ?Vitals:  ? 02/11/22 0825  ?BP: 135/75  ?Pulse: 99  ?Resp: 18  ?Temp: (!) 97.2 ?F (36.2 ?C)  ?SpO2: 99%  ? ?Filed Weights  ? 02/11/22 0825  ?Weight: 195 lb (88.5 kg)  ? ?  ? ?LABORATORY DATA:  ?I have reviewed the data as listed ? ?  Latest Ref Rng & Units 01/21/2022  ?  9:29 AM 01/07/2022  ?  9:36 AM 12/31/2021  ?  8:18 AM  ?CMP  ?Glucose 70 - 99 mg/dL 162   193   193    ?BUN 6 - 20 mg/dL _0 ?Creatinine 0.44 - 1.00 mg/dL 0.59   0.60   0.73    ?Sodium 135 - 145 mmol/L 138   136   132    ?Potassium 3.5 - 5.1 mmol/L 3.6   4.1   3.5    ?Chloride 98 - 111 mmol/L 105   101   101    ?CO2 22 - 32 mmol/L _1 ?Calcium 8.9 - 10.3 mg/dL 9.3   8.9   8.4    ?Total Protein 6.5 - 8.1 g/dL 6.7   6.5   7.3     ?Total Bilirubin 0.3 - 1.2 mg/dL 0.4   0.4   0.2    ?Alkaline Phos 38 - 126 U/L 99   106   80    ?AST 15 - 41 U/L _2 ?ALT 0 - 44 U/L _3 ? ? ?Lab Results  ?Component Value Date  ? WBC 12.8 (H) 02/11/2022  ? HGB 10.9 (L) 02/11/2022  ? HCT 33.0 (L) 02/11/2022  ? MCV 87.3 02/11/2022  ? PLT 328 02/11/2022  ? NEUTROABS 10.5 (H) 02/11/2022  ? ? ?ASSESSMENT & PLAN:  ?Malignant neoplasm of upper-outer quadrant of left breast in female, estrogen receptor positive ([place]) ?12/03/2021: Mammogram detected left breast mass measuring 5.2 cm at 12 o'clock position biopsy on 12/03/2021 showed recurrent grade 3 IDC ER+(40%/PR-/Her2-).  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  ?Treatment plan: ?1.  Neoadjuvant chemotherapy with Taxotere and Cytoxan Pembrolizumab every 3 weeks x4 followed by maintenance immunotherapy for 1 year ?2. bilateral mastectomies with reconstruction ?3.  Adjuvant antiestrogen therapy ?4.  Consideration for adjuvant olaparib based upon BRCA1 gene mutation ? CT CAP and bone scan: No evidence of distant metastatic disease ?---------------------------------------------------------------------------------------------------------------- ?Current Treatment: Cycle 3 Taxotere and Cytoxan (Pembro) insurance refused) ?Labs reviewed ?  ?Chemo Toxicities: ?Bone pain from Neulasta: She took naproxen and Claritin which helped ?2. severe anxiety and lack of sleep: trazodone. ?3.  Nausea: I added Emend to her treatment. ?4.  Thrush: Sent a prescription for nystatin ? ?Surgery plans: Patient is planning to go to [facility] plastic surgery for another opinion.  She went to [facility] and also met with Dr. Iran Planas.  She is getting conflicting information therefore she is confused. ?  ?RTC in 3 weeks for cycle 4  ? ? ? ?Orders Placed  This Encounter  ?Procedures  ? MR BREAST BILATERAL W WO CONTRAST INC CAD  ?  Standing Status:   Future  ?  Standing Expiration Date:   02/12/2023  ?  Order Specific Question:   If indicated for the ordered procedure, I authorize the administration of contrast media per Radiology protocol  ?  Answer:   Yes  ?  Order Specific Question:   What is the patient's sedation requirement?  ?  Answer:   No Sedation  ?  Order Specific Question:   Does  the patient have a pacemaker or implanted devices?  ?  Answer:   No  ?  Order Specific Question:   Preferred imaging location?  ?  Answer:   [facility] (table limit-550lbs)  ?  Order Specific Question:   Release to patient  ?  Answer:   Immediate  ? ?The patient has a good understanding of the overall plan. she agrees with it. she will call with any problems that may develop before the next visit here. ?Total time spent: 30 mins including face to face time and time spent for planning, charting and co-ordination of care ? ? Harriette Ohara, MD ?02/11/22 ? ? ? I Gardiner Coins am scribing for Dr. Lindi Adie ? ?I have reviewed the above documentation for accuracy and completeness, and I agree with the above. ?. ?

## 2022-02-11 ENCOUNTER — Inpatient Hospital Stay (HOSPITAL_BASED_OUTPATIENT_CLINIC_OR_DEPARTMENT_OTHER): Payer: BC Managed Care – PPO | Admitting: Hematology and Oncology

## 2022-02-11 ENCOUNTER — Inpatient Hospital Stay: Payer: BC Managed Care – PPO

## 2022-02-11 ENCOUNTER — Inpatient Hospital Stay: Payer: BC Managed Care – PPO | Admitting: Licensed Clinical Social Worker

## 2022-02-11 ENCOUNTER — Other Ambulatory Visit: Payer: BC Managed Care – PPO

## 2022-02-11 ENCOUNTER — Encounter: Payer: Self-pay | Admitting: *Deleted

## 2022-02-11 ENCOUNTER — Ambulatory Visit: Payer: BC Managed Care – PPO

## 2022-02-11 ENCOUNTER — Other Ambulatory Visit: Payer: Self-pay

## 2022-02-11 DIAGNOSIS — Z95828 Presence of other vascular implants and grafts: Secondary | ICD-10-CM

## 2022-02-11 DIAGNOSIS — C50412 Malignant neoplasm of upper-outer quadrant of left female breast: Secondary | ICD-10-CM

## 2022-02-11 DIAGNOSIS — Z170 Estrogen receptor positive status [ER+]: Secondary | ICD-10-CM | POA: Diagnosis not present

## 2022-02-11 LAB — CMP (CANCER CENTER ONLY)
ALT: 18 U/L (ref 0–44)
AST: 11 U/L — ABNORMAL LOW (ref 15–41)
Albumin: 4.1 g/dL (ref 3.5–5.0)
Alkaline Phosphatase: 93 U/L (ref 38–126)
Anion gap: 8 (ref 5–15)
BUN: 25 mg/dL — ABNORMAL HIGH (ref 6–20)
CO2: 26 mmol/L (ref 22–32)
Calcium: 9.3 mg/dL (ref 8.9–10.3)
Chloride: 102 mmol/L (ref 98–111)
Creatinine: 0.68 mg/dL (ref 0.44–1.00)
GFR, Estimated: >60 mL/min (ref >60)
Glucose, Bld: 176 mg/dL — ABNORMAL HIGH (ref 70–99)
Potassium: 3.7 mmol/L (ref 3.5–5.1)
Sodium: 136 mmol/L (ref 135–145)
Total Bilirubin: 0.3 mg/dL (ref 0.3–1.2)
Total Protein: 6.8 g/dL (ref 6.5–8.1)

## 2022-02-11 LAB — CBC WITH DIFFERENTIAL (CANCER CENTER ONLY)
Abs Immature Granulocytes: 0.05 10*3/uL (ref 0.00–0.07)
Basophils Absolute: 0 10*3/uL (ref 0.0–0.1)
Basophils Relative: 0 %
Eosinophils Absolute: 0 10*3/uL (ref 0.0–0.5)
Eosinophils Relative: 0 %
HCT: 33 % — ABNORMAL LOW (ref 36.0–46.0)
Hemoglobin: 10.9 g/dL — ABNORMAL LOW (ref 12.0–15.0)
Immature Granulocytes: 0 %
Lymphocytes Relative: 10 %
Lymphs Abs: 1.3 10*3/uL (ref 0.7–4.0)
MCH: 28.8 pg (ref 26.0–34.0)
MCHC: 33 g/dL (ref 30.0–36.0)
MCV: 87.3 fL (ref 80.0–100.0)
Monocytes Absolute: 1 10*3/uL (ref 0.1–1.0)
Monocytes Relative: 8 %
Neutro Abs: 10.5 10*3/uL — ABNORMAL HIGH (ref 1.7–7.7)
Neutrophils Relative %: 82 %
Platelet Count: 328 10*3/uL (ref 150–400)
RBC: 3.78 MIL/uL — ABNORMAL LOW (ref 3.87–5.11)
RDW: 15.9 % — ABNORMAL HIGH (ref 11.5–15.5)
WBC Count: 12.8 10*3/uL — ABNORMAL HIGH (ref 4.0–10.5)
nRBC: 0 % (ref 0.0–0.2)

## 2022-02-11 LAB — PREGNANCY, URINE: Preg Test, Ur: NEGATIVE

## 2022-02-11 MED ORDER — NYSTATIN 100000 UNIT/ML MT SUSP: 5.0000 mL | Freq: Four times a day (QID) | OROMUCOSAL | 1 refills | Status: DC

## 2022-02-11 MED ORDER — SODIUM CHLORIDE 0.9% FLUSH
10.0000 mL | Freq: Once | INTRAVENOUS | Status: AC
  Administered 2022-02-11: 10 mL

## 2022-02-11 MED ORDER — HEPARIN SOD (PORK) LOCK FLUSH 100 UNIT/ML IV SOLN
500.0000 [IU] | Freq: Once | INTRAVENOUS | Status: AC | PRN
  Administered 2022-02-11: 500 [IU]

## 2022-02-11 MED ORDER — SODIUM CHLORIDE 0.9 % IV SOLN
65.0000 mg/m2 | Freq: Once | INTRAVENOUS | Status: AC
  Administered 2022-02-11: 130 mg via INTRAVENOUS
  Filled 2022-02-11: qty 13

## 2022-02-11 MED ORDER — SODIUM CHLORIDE 0.9 % IV SOLN
600.0000 mg/m2 | Freq: Once | INTRAVENOUS | Status: AC
  Administered 2022-02-11: 1180 mg via INTRAVENOUS
  Filled 2022-02-11: qty 59

## 2022-02-11 MED ORDER — SODIUM CHLORIDE 0.9 % IV SOLN: Freq: Once | INTRAVENOUS | Status: AC

## 2022-02-11 MED ORDER — PALONOSETRON HCL INJECTION 0.25 MG/5ML
0.2500 mg | Freq: Once | INTRAVENOUS | Status: AC
  Administered 2022-02-11: 0.25 mg via INTRAVENOUS
  Filled 2022-02-11: qty 5

## 2022-02-11 MED ORDER — SODIUM CHLORIDE 0.9 % IV SOLN
150.0000 mg | Freq: Once | INTRAVENOUS | Status: AC
  Administered 2022-02-11: 150 mg via INTRAVENOUS
  Filled 2022-02-11: qty 150

## 2022-02-11 MED ORDER — SODIUM CHLORIDE 0.9 % IV SOLN
10.0000 mg | Freq: Once | INTRAVENOUS | Status: AC
  Administered 2022-02-11: 10 mg via INTRAVENOUS
  Filled 2022-02-11: qty 10

## 2022-02-11 MED ORDER — SODIUM CHLORIDE 0.9% FLUSH
10.0000 mL | INTRAVENOUS | Status: DC | PRN
  Administered 2022-02-11: 10 mL

## 2022-02-11 NOTE — Patient Instructions (Signed)
[facility]  Discharge Instructions: ?Thank you for choosing [facility] to provide your oncology and hematology care.  ? ?If you have a lab appointment with the [facility], please go directly to the [facility] and check in at the registration area. ?  ?Wear comfortable clothing and clothing appropriate for easy access to any Portacath or PICC line.  ? ?We strive to give you quality time with your provider. You may need to reschedule your appointment if you arrive late (15 or more minutes).  Arriving late affects you and other patients whose appointments are after yours.  Also, if you miss three or more appointments without notifying the office, you may be dismissed from the clinic at the provider?s discretion.    ?  ?For prescription refill requests, have your pharmacy contact our office and allow 72 hours for refills to be completed.   ? ?Today you received the following chemotherapy and/or immunotherapy agents: Taxotere/Cytoxan.    ?  ?To help prevent nausea and vomiting after your treatment, we encourage you to take your nausea medication as directed. ? ?BELOW ARE SYMPTOMS THAT SHOULD BE REPORTED IMMEDIATELY: ?*FEVER GREATER THAN 100.4 F (38 ?C) OR HIGHER ?*CHILLS OR SWEATING ?*NAUSEA AND VOMITING THAT IS NOT CONTROLLED WITH YOUR NAUSEA MEDICATION ?*UNUSUAL SHORTNESS OF BREATH ?*UNUSUAL BRUISING OR BLEEDING ?*URINARY PROBLEMS (pain or burning when urinating, or frequent urination) ?*BOWEL PROBLEMS (unusual diarrhea, constipation, pain near the anus) ?TENDERNESS IN MOUTH AND THROAT WITH OR WITHOUT PRESENCE OF ULCERS (sore throat, sores in mouth, or a toothache) ?UNUSUAL RASH, SWELLING OR PAIN  ?UNUSUAL VAGINAL DISCHARGE OR ITCHING  ? ?Items with * indicate a potential emergency and should be followed up as soon as possible or go to the Emergency Department if any problems should occur. ? ?Please show the CHEMOTHERAPY ALERT CARD or IMMUNOTHERAPY ALERT CARD at  check-in to the Emergency Department and triage nurse. ? ?Should you have questions after your visit or need to cancel or reschedule your appointment, please contact [facility]  Dept: [phone]  and follow the prompts.  Office hours are 8:00 a.m. to 4:30 p.m. Monday - Friday. Please note that voicemails left after 4:00 p.m. may not be returned until the following business day.  We are closed weekends and major holidays. You have access to a nurse at all times for urgent questions. Please call the main number to the clinic Dept: [phone] and follow the prompts. ? ? ?For any non-urgent questions, you may also contact your provider using MyChart. We now offer e-Visits for anyone 39 and older to request care online for non-urgent symptoms. For details visit [email or web address]. ?  ?Also download the MyChart app! Go to the app store, search "MyChart", open the app, select , and log in with your MyChart username and password. ? ?Due to Covid, a mask is required upon entering the hospital/clinic. If you do not have a mask, one will be given to you upon arrival. For doctor visits, patients may have 1 support person aged 56 or older with them. For treatment visits, patients cannot have anyone with them due to current Covid guidelines and our immunocompromised population.  ? ?

## 2022-02-11 NOTE — Progress Notes (Signed)
[facility] CSW Progress Note ? ?Clinical Social Worker met with patient to provide ongoing support. Pt reports having a tough day mentally today but that she is proud she is still here and 75% done with chemo.  CSW provided active listening today and normalizing feelings. Pt has been able to spend some time outside which has been helpful and is planning a trip to [place] with her fiance for 4 weeks after her last treatment.  Pt has had a good night with her mother who was very supportive and offered to come to [place] to support her if pt ends up doing surgery there. ? ? ?Pt is anticipating a visit with surgeon in [place] to determine her reconstruction options. She will want support accepting the mastectomies and adjusting once she knows the reconstruction plan. Pt will contact CSW to schedule when she is ready. ? ? ?Christeen Douglas LCSW, LCSW ?

## 2022-02-12 ENCOUNTER — Telehealth: Payer: Self-pay | Admitting: *Deleted

## 2022-02-12 NOTE — Telephone Encounter (Signed)
Pt scheduled to see. Dr. Lenna Gilford surgeon at [facility] in [place] for consult. Physician team notified. ?

## 2022-02-13 ENCOUNTER — Inpatient Hospital Stay: Payer: BC Managed Care – PPO

## 2022-02-13 ENCOUNTER — Ambulatory Visit: Payer: BC Managed Care – PPO

## 2022-02-13 ENCOUNTER — Other Ambulatory Visit: Payer: Self-pay

## 2022-02-13 VITALS — BP 100/62 | HR 79 | Temp 98.3°F | Resp 20

## 2022-02-13 DIAGNOSIS — Z170 Estrogen receptor positive status [ER+]: Secondary | ICD-10-CM

## 2022-02-13 DIAGNOSIS — C50412 Malignant neoplasm of upper-outer quadrant of left female breast: Secondary | ICD-10-CM | POA: Diagnosis not present

## 2022-02-13 MED ORDER — PEGFILGRASTIM-CBQV 6 MG/0.6ML ~~LOC~~ SOSY
6.0000 mg | PREFILLED_SYRINGE | Freq: Once | SUBCUTANEOUS | Status: AC
  Administered 2022-02-13: 6 mg via SUBCUTANEOUS
  Filled 2022-02-13: qty 0.6

## 2022-03-03 ENCOUNTER — Other Ambulatory Visit: Payer: Self-pay | Admitting: *Deleted

## 2022-03-03 MED ORDER — NYSTATIN 100000 UNIT/ML MT SUSP: 5.0000 mL | Freq: Four times a day (QID) | OROMUCOSAL | 1 refills | Status: DC

## 2022-03-04 ENCOUNTER — Inpatient Hospital Stay: Payer: BC Managed Care – PPO | Admitting: Licensed Clinical Social Worker

## 2022-03-04 ENCOUNTER — Other Ambulatory Visit: Payer: Self-pay

## 2022-03-04 ENCOUNTER — Encounter: Payer: Self-pay | Admitting: *Deleted

## 2022-03-04 ENCOUNTER — Inpatient Hospital Stay (HOSPITAL_BASED_OUTPATIENT_CLINIC_OR_DEPARTMENT_OTHER): Payer: BC Managed Care – PPO | Admitting: Adult Health

## 2022-03-04 ENCOUNTER — Ambulatory Visit: Payer: BC Managed Care – PPO

## 2022-03-04 ENCOUNTER — Inpatient Hospital Stay: Payer: BC Managed Care – PPO

## 2022-03-04 ENCOUNTER — Inpatient Hospital Stay: Payer: BC Managed Care – PPO | Attending: Hematology and Oncology

## 2022-03-04 ENCOUNTER — Encounter: Payer: Self-pay | Admitting: Adult Health

## 2022-03-04 VITALS — BP 140/74 | HR 89 | Temp 97.9°F | Resp 18 | Ht 62.0 in | Wt 197.4 lb

## 2022-03-04 DIAGNOSIS — Z7952 Long term (current) use of systemic steroids: Secondary | ICD-10-CM | POA: Diagnosis not present

## 2022-03-04 DIAGNOSIS — Z5111 Encounter for antineoplastic chemotherapy: Secondary | ICD-10-CM | POA: Diagnosis not present

## 2022-03-04 DIAGNOSIS — R21 Rash and other nonspecific skin eruption: Secondary | ICD-10-CM | POA: Insufficient documentation

## 2022-03-04 DIAGNOSIS — B370 Candidal stomatitis: Secondary | ICD-10-CM

## 2022-03-04 DIAGNOSIS — Z170 Estrogen receptor positive status [ER+]: Secondary | ICD-10-CM | POA: Insufficient documentation

## 2022-03-04 DIAGNOSIS — Z95828 Presence of other vascular implants and grafts: Secondary | ICD-10-CM

## 2022-03-04 DIAGNOSIS — C50412 Malignant neoplasm of upper-outer quadrant of left female breast: Secondary | ICD-10-CM | POA: Insufficient documentation

## 2022-03-04 DIAGNOSIS — K219 Gastro-esophageal reflux disease without esophagitis: Secondary | ICD-10-CM | POA: Insufficient documentation

## 2022-03-04 DIAGNOSIS — Z923 Personal history of irradiation: Secondary | ICD-10-CM | POA: Diagnosis not present

## 2022-03-04 DIAGNOSIS — Z5189 Encounter for other specified aftercare: Secondary | ICD-10-CM | POA: Diagnosis not present

## 2022-03-04 LAB — CBC WITH DIFFERENTIAL (CANCER CENTER ONLY)
Abs Immature Granulocytes: 0.03 10*3/uL (ref 0.00–0.07)
Basophils Absolute: 0 10*3/uL (ref 0.0–0.1)
Basophils Relative: 0 %
Eosinophils Absolute: 0 10*3/uL (ref 0.0–0.5)
Eosinophils Relative: 0 %
HCT: 30.1 % — ABNORMAL LOW (ref 36.0–46.0)
Hemoglobin: 10.1 g/dL — ABNORMAL LOW (ref 12.0–15.0)
Immature Granulocytes: 0 %
Lymphocytes Relative: 11 %
Lymphs Abs: 1.1 10*3/uL (ref 0.7–4.0)
MCH: 30.1 pg (ref 26.0–34.0)
MCHC: 33.6 g/dL (ref 30.0–36.0)
MCV: 89.9 fL (ref 80.0–100.0)
Monocytes Absolute: 0.7 10*3/uL (ref 0.1–1.0)
Monocytes Relative: 7 %
Neutro Abs: 8.4 10*3/uL — ABNORMAL HIGH (ref 1.7–7.7)
Neutrophils Relative %: 82 %
Platelet Count: 374 10*3/uL (ref 150–400)
RBC: 3.35 MIL/uL — ABNORMAL LOW (ref 3.87–5.11)
RDW: 17.7 % — ABNORMAL HIGH (ref 11.5–15.5)
WBC Count: 10.2 10*3/uL (ref 4.0–10.5)
nRBC: 0 % (ref 0.0–0.2)

## 2022-03-04 LAB — CMP (CANCER CENTER ONLY)
ALT: 16 U/L (ref 0–44)
AST: 11 U/L — ABNORMAL LOW (ref 15–41)
Albumin: 4 g/dL (ref 3.5–5.0)
Alkaline Phosphatase: 84 U/L (ref 38–126)
Anion gap: 7 (ref 5–15)
BUN: 18 mg/dL (ref 6–20)
CO2: 26 mmol/L (ref 22–32)
Calcium: 8.9 mg/dL (ref 8.9–10.3)
Chloride: 105 mmol/L (ref 98–111)
Creatinine: 0.71 mg/dL (ref 0.44–1.00)
GFR, Estimated: >60 mL/min (ref >60)
Glucose, Bld: 188 mg/dL — ABNORMAL HIGH (ref 70–99)
Potassium: 3.7 mmol/L (ref 3.5–5.1)
Sodium: 138 mmol/L (ref 135–145)
Total Bilirubin: 0.3 mg/dL (ref 0.3–1.2)
Total Protein: 6.7 g/dL (ref 6.5–8.1)

## 2022-03-04 LAB — PREGNANCY, URINE: Preg Test, Ur: NEGATIVE

## 2022-03-04 MED ORDER — SODIUM CHLORIDE 0.9 % IV SOLN: Freq: Once | INTRAVENOUS | Status: AC

## 2022-03-04 MED ORDER — SODIUM CHLORIDE 0.9% FLUSH
10.0000 mL | Freq: Once | INTRAVENOUS | Status: AC
  Administered 2022-03-04: 10 mL

## 2022-03-04 MED ORDER — SODIUM CHLORIDE 0.9 % IV SOLN
150.0000 mg | Freq: Once | INTRAVENOUS | Status: AC
  Administered 2022-03-04: 150 mg via INTRAVENOUS
  Filled 2022-03-04: qty 150

## 2022-03-04 MED ORDER — SODIUM CHLORIDE 0.9 % IV SOLN
65.0000 mg/m2 | Freq: Once | INTRAVENOUS | Status: AC
  Administered 2022-03-04: 130 mg via INTRAVENOUS
  Filled 2022-03-04: qty 13

## 2022-03-04 MED ORDER — PALONOSETRON HCL INJECTION 0.25 MG/5ML
0.2500 mg | Freq: Once | INTRAVENOUS | Status: AC
  Administered 2022-03-04: 0.25 mg via INTRAVENOUS

## 2022-03-04 MED ORDER — HEPARIN SOD (PORK) LOCK FLUSH 100 UNIT/ML IV SOLN
500.0000 [IU] | Freq: Once | INTRAVENOUS | Status: AC | PRN
  Administered 2022-03-04: 500 [IU]

## 2022-03-04 MED ORDER — FLUCONAZOLE 200 MG PO TABS: 200.0000 mg | ORAL_TABLET | Freq: Every day | ORAL | 0 refills | Status: AC

## 2022-03-04 MED ORDER — SODIUM CHLORIDE 0.9 % IV SOLN
10.0000 mg | Freq: Once | INTRAVENOUS | Status: AC
  Administered 2022-03-04: 10 mg via INTRAVENOUS
  Filled 2022-03-04: qty 10

## 2022-03-04 MED ORDER — CLOTRIMAZOLE 10 MG MT TROC: 10.0000 mg | Freq: Every day | OROMUCOSAL | 0 refills | Status: AC

## 2022-03-04 MED ORDER — SODIUM CHLORIDE 0.9 % IV SOLN
600.0000 mg/m2 | Freq: Once | INTRAVENOUS | Status: AC
  Administered 2022-03-04: 1180 mg via INTRAVENOUS
  Filled 2022-03-04: qty 59

## 2022-03-04 MED ORDER — SODIUM CHLORIDE 0.9% FLUSH
10.0000 mL | INTRAVENOUS | Status: DC | PRN
  Administered 2022-03-04: 10 mL

## 2022-03-04 NOTE — Patient Instructions (Signed)
[facility]  Discharge Instructions: ?Thank you for choosing [facility] to provide your oncology and hematology care.  ? ?If you have a lab appointment with the [facility], please go directly to the [facility] and check in at the registration area. ?  ?Wear comfortable clothing and clothing appropriate for easy access to any Portacath or PICC line.  ? ?We strive to give you quality time with your provider. You may need to reschedule your appointment if you arrive late (15 or more minutes).  Arriving late affects you and other patients whose appointments are after yours.  Also, if you miss three or more appointments without notifying the office, you may be dismissed from the clinic at the provider?s discretion.    ?  ?For prescription refill requests, have your pharmacy contact our office and allow 72 hours for refills to be completed.   ? ?Today you received the following chemotherapy and/or immunotherapy agents: Taxotere/Cytoxan.    ?  ?To help prevent nausea and vomiting after your treatment, we encourage you to take your nausea medication as directed. ? ?BELOW ARE SYMPTOMS THAT SHOULD BE REPORTED IMMEDIATELY: ?*FEVER GREATER THAN 100.4 F (38 ?C) OR HIGHER ?*CHILLS OR SWEATING ?*NAUSEA AND VOMITING THAT IS NOT CONTROLLED WITH YOUR NAUSEA MEDICATION ?*UNUSUAL SHORTNESS OF BREATH ?*UNUSUAL BRUISING OR BLEEDING ?*URINARY PROBLEMS (pain or burning when urinating, or frequent urination) ?*BOWEL PROBLEMS (unusual diarrhea, constipation, pain near the anus) ?TENDERNESS IN MOUTH AND THROAT WITH OR WITHOUT PRESENCE OF ULCERS (sore throat, sores in mouth, or a toothache) ?UNUSUAL RASH, SWELLING OR PAIN  ?UNUSUAL VAGINAL DISCHARGE OR ITCHING  ? ?Items with * indicate a potential emergency and should be followed up as soon as possible or go to the Emergency Department if any problems should occur. ? ?Please show the CHEMOTHERAPY ALERT CARD or IMMUNOTHERAPY ALERT CARD at  check-in to the Emergency Department and triage nurse. ? ?Should you have questions after your visit or need to cancel or reschedule your appointment, please contact [facility]  Dept: [phone]  and follow the prompts.  Office hours are 8:00 a.m. to 4:30 p.m. Monday - Friday. Please note that voicemails left after 4:00 p.m. may not be returned until the following business day.  We are closed weekends and major holidays. You have access to a nurse at all times for urgent questions. Please call the main number to the clinic Dept: [phone] and follow the prompts. ? ? ?For any non-urgent questions, you may also contact your provider using MyChart. We now offer e-Visits for anyone 17 and older to request care online for non-urgent symptoms. For details visit [email or web address]. ?  ?Also download the MyChart app! Go to the app store, search "MyChart", open the app, select [facility], and log in with your MyChart username and password. ? ?Due to Covid, a mask is required upon entering the hospital/clinic. If you do not have a mask, one will be given to you upon arrival. For doctor visits, patients may have 1 support person aged 1 or older with them. For treatment visits, patients cannot have anyone with them due to current Covid guidelines and our immunocompromised population.  ? ?

## 2022-03-04 NOTE — Progress Notes (Signed)
[facility] CSW Progress Note ? ?Clinical Social Worker met with patient to provide ongoing support during last infusion treatment. Patient is overall doing well although has been more emotional the last 2 days. She has had many changes since last visit (families meeting, planning her move to [place], scheduling consult at [facility], purging her home, etc). Crystal Lane feels more free and less isolated since the end of a friendship and has had more visitors since then. She has also been more active going outside and walking 2-3 miles each day. CSW commended efforts made with activity and setting short-term goals and things to look forward to. ? ?CSW encouraged pt to reach out after her consult for reconstruction on 5/3. ? ? ? ?Christeen Douglas LCSW ?

## 2022-03-04 NOTE — Progress Notes (Signed)
[facility] Follow up: ?  ? ?Pcp, No ?No address on file ? ? ?DIAGNOSIS:  Cancer Staging  ?Malignant neoplasm of upper-outer quadrant of left breast in female, estrogen receptor positive ([facility]) ?Staging form: Breast, AJCC 8th Edition ?- Clinical: Stage IIIA (cT3, cN0, cM0, G3, ER+, PR-, HER2-) - Signed by Gardenia Phlegm, NP on 03/06/2022 ?Stage prefix: Initial diagnosis ?Histologic grading system: 3 grade system ? ? ?SUMMARY OF ONCOLOGIC HISTORY: ?Oncology History  ?Malignant neoplasm of upper-outer quadrant of left breast in female, estrogen receptor positive ([facility])  ?03/2010 Miscellaneous  ? BRCA 1 mutation detected at [facility] May 2011 (IVS5--11T>G] ?  ?03/2011 - 08/2011 Neo-Adjuvant Chemotherapy  ? neoadjuvantly with 4 cycles of dose dense doxorubicin and cyclophosphamide followed by 3 of 12 planned doses of weekly paclitaxel, discontinued because of concerns regarding neuropathy ?  ?09/29/2011 Surgery  ? left lumpectomy and sentinel lymph node sampling 09/29/2011 for a residual ypT1c ypN0 invasive ductal carcinoma, grade 3 ?  ?11/2011 - 12/2011 Radiation Therapy  ? adjuvant radiation  ?  ?12/03/2021 Initial Diagnosis  ? Mammogram detected left breast mass measuring 5.2 cm at 12 o'clock position biopsy on 12/03/2021 showed recurrent grade 3 IDC ER+(40%/PR-/Her2-).  Ki-67 40% ?  ?12/31/2021 -  Chemotherapy  ? Patient is on Treatment Plan : BREAST TC q21d  ? ?  ?  ?03/06/2022 Cancer Staging  ? Staging form: Breast, AJCC 8th Edition ?- Clinical: Stage IIIA (cT3, cN0, cM0, G3, ER+, PR-, HER2-) - Signed by Gardenia Phlegm, NP on 03/06/2022 ?Stage prefix: Initial diagnosis ?Histologic grading system: 3 grade system ? ?  ? ? ?CURRENT THERAPY: Taxotere/Cytoxan ? ?INTERVAL HISTORY: ?Crystal Lane 46 y.o. female returns for follow-up prior to receiving her fourth and final cycle of neoadjuvant Taxotere and Cytoxan.  She tells me that she is doing moderately well.  She has opted to forego  taking the pembrolizumab at this point due to insurance is initial denial.  She has continued to use the Digna cap and has lost some of her hair in the front of her scalp.  She continues to exercise regularly and has questions mainly about sunscreen use when she goes to [place] in a few weeks.  She denies any peripheral neuropathy. ? ? ?Patient Active Problem List  ? Diagnosis Date Noted  ? Port-A-Cath in place 01/21/2022  ? Malignant neoplasm of upper-outer quadrant of left breast in female, estrogen receptor positive ([facility]) 12/09/2021  ? Dense breasts 02/05/2014  ? History of breast cancer 11/23/2012  ? ? ?is allergic to lorazepam, oseltamivir, tomato, betadine [povidone iodine], chlorhexidine gluconate, and gelatin. ? ?MEDICAL HISTORY: ?Past Medical History:  ?Diagnosis Date  ? Anxiety   ? due to dx. of breast CA  ? Breast cancer ([facility])   ? left  ? Complication of anesthesia   ? excessive talking while sedated  ? GERD (gastroesophageal reflux disease)   ? Seasonal allergies   ? current non-prod. cough, congestion  ? ? ?SURGICAL HISTORY: ?Past Surgical History:  ?Procedure Laterality Date  ? APPENDECTOMY  09/02/2005  ? BREAST BIOPSY Left 12/03/2021  ? BREAST LUMPECTOMY  09/29/2011  ? Procedure: BREAST LUMPECTOMY WITH EXCISION OF SENTINEL NODE;  Surgeon: Shann Medal, MD;  Location: [facility];  Service: General;  Laterality: Left;  Needle [facility] 7:30, SLNbx(nuc med  9:30)  ? BREAST LUMPECTOMY W/ NEEDLE LOCALIZATION  09/29/2011  ? left /SLNBx  ? PORT-A-CATH REMOVAL  10/26/2011  ?  Procedure: REMOVAL PORT-A-CATH;  Surgeon: Shann Medal, MD;  Location: [facility];  Service: General;  Laterality: Right;  ? PORTACATH PLACEMENT  04/17/2011  ? PORTACATH PLACEMENT Right 12/25/2021  ? Procedure: INSERTION PORT-A-CATH;  Surgeon: Rolm Bookbinder, MD;  Location: [facility];  Service: General;  Laterality: Right;  ? TYMPANOSTOMY TUBE PLACEMENT Bilateral 1980  ? WISDOM TOOTH  EXTRACTION    ? ? ?SOCIAL HISTORY: ?Social History  ? ?Socioeconomic History  ? Marital status: Significant Other  ?  Spouse name: Not on file  ? Number of children: Not on file  ? Years of education: Not on file  ? Highest education level: Not on file  ?Occupational History  ? Not on file  ?Tobacco Use  ? Smoking status: Never  ? Smokeless tobacco: Never  ?Vaping Use  ? Vaping Use: Never used  ?Substance and Sexual Activity  ? Alcohol use: Yes  ?  Comment: rare  ? Drug use: No  ? Sexual activity: Yes  ?Other Topics Concern  ? Not on file  ?Social History Narrative  ? Not on file  ? ?Social Determinants of Health  ? ?Financial Resource Strain: Low Risk   ? Difficulty of Paying Living Expenses: Not hard at all  ?Food Insecurity: No Food Insecurity  ? Worried About Charity fundraiser in the Last Year: Never true  ? Ran Out of Food in the Last Year: Never true  ?Transportation Needs: No Transportation Needs  ? Lack of Transportation (Medical): No  ? Lack of Transportation (Non-Medical): No  ?Physical Activity: Not on file  ?Stress: Not on file  ?Social Connections: Not on file  ?Intimate Partner Violence: Not on file  ? ? ?FAMILY HISTORY: ?Family History  ?Problem Relation Age of Onset  ? Diabetes Father   ? Hyperlipidemia Father   ? ? ?Review of Systems  ?Constitutional:  Positive for fatigue. Negative for appetite change, chills, fever and unexpected weight change.  ?HENT:   Negative for hearing loss, lump/mass and trouble swallowing.   ?Eyes:  Negative for eye problems and icterus.  ?Respiratory:  Negative for chest tightness, cough and shortness of breath.   ?Cardiovascular:  Negative for chest pain, leg swelling and palpitations.  ?Gastrointestinal:  Negative for abdominal distention, abdominal pain, constipation, diarrhea, nausea and vomiting.  ?Endocrine: Negative for hot flashes.  ?Genitourinary:  Negative for difficulty urinating.   ?Musculoskeletal:  Negative for arthralgias.  ?Skin:  Negative for itching  and rash.  ?Neurological:  Negative for dizziness, extremity weakness, headaches and numbness.  ?Hematological:  Negative for adenopathy. Does not bruise/bleed easily.  ?Psychiatric/Behavioral:  Negative for depression. The patient is not nervous/anxious.    ? ? ?PHYSICAL EXAMINATION ? ?ECOG PERFORMANCE STATUS: 1 - Symptomatic but completely ambulatory ? ?Vitals:  ? 03/04/22 1023  ?BP: 140/74  ?Pulse: 89  ?Resp: 18  ?Temp: 97.9 ?F (36.6 ?C)  ?SpO2: 100%  ? ? ?Physical Exam ?Constitutional:   ?   General: She is not in acute distress. ?   Appearance: Normal appearance. She is not toxic-appearing.  ?HENT:  ?   Head: Normocephalic and atraumatic.  ?Eyes:  ?   General: No scleral icterus. ?Cardiovascular:  ?   Rate and Rhythm: Normal rate and regular rhythm.  ?   Pulses: Normal pulses.  ?   Heart sounds: Normal heart sounds.  ?Pulmonary:  ?   Effort: Pulmonary effort is normal.  ?   Breath sounds: Normal breath sounds.  ?Abdominal:  ?  General: Abdomen is flat. Bowel sounds are normal. There is no distension.  ?   Palpations: Abdomen is soft.  ?   Tenderness: There is no abdominal tenderness.  ?Musculoskeletal:     ?   General: No swelling.  ?   Cervical back: Neck supple.  ?Lymphadenopathy:  ?   Cervical: No cervical adenopathy.  ?Skin: ?   General: Skin is warm and dry.  ?   Findings: No rash.  ?Neurological:  ?   General: No focal deficit present.  ?   Mental Status: She is alert.  ?Psychiatric:     ?   Mood and Affect: Mood normal.     ?   Behavior: Behavior normal.  ? ? ?LABORATORY DATA: ? ?CBC ?   ?Component Value Date/Time  ? WBC 10.2 03/04/2022 1007  ? WBC 8.8 06/19/2016 0853  ? WBC 7.1 09/25/2011 1600  ? RBC 3.35 (L) 03/04/2022 1007  ? HGB 10.1 (L) 03/04/2022 1007  ? HGB 13.8 06/19/2016 0853  ? HCT 30.1 (L) 03/04/2022 1007  ? HCT 41.4 06/19/2016 0853  ? PLT 374 03/04/2022 1007  ? PLT 260 06/19/2016 0853  ? MCV 89.9 03/04/2022 1007  ? MCV 86.4 06/19/2016 0853  ? MCH 30.1 03/04/2022 1007  ? MCHC 33.6 03/04/2022  1007  ? RDW 17.7 (H) 03/04/2022 1007  ? RDW 13.5 06/19/2016 0853  ? LYMPHSABS 1.1 03/04/2022 1007  ? LYMPHSABS 1.5 06/19/2016 0853  ? MONOABS 0.7 03/04/2022 1007  ? MONOABS 0.4 06/19/2016 0853  ? EOSABS 0.0 04

## 2022-03-04 NOTE — Assessment & Plan Note (Addendum)
12/03/2021:?Mammogram detected left breast mass measuring 5.2 cm at 12 o'clock position biopsy on 12/03/2021 showed recurrent grade 3 IDC ER+(40%/PR-/Her2-). ?Ki-67 40% ?(BRCA 1 mutation positive: Prior history: [place] Lane 2012: 4.3 cm triple negative breast cancer status post NAC with?AC-T?followed by lumpectomy, radiation) ?This is a?functional triple negative breast cancer ?? ?Treatment plan: ?1.??Neoadjuvant chemotherapy with Taxotere and Cytoxan Pembrolizumab?every 3 weeks x4?followed by maintenance immunotherapy for 1 year ?2.?bilateral mastectomies?with reconstruction ?3.??Adjuvant antiestrogen therapy ?4.??Consideration for adjuvant olaparib based upon BRCA1 gene mutation ??CT CAP and bone scan: No evidence of distant metastatic disease--fullness in cervix and adnexal cyst ?---------------------------------------------------------------------------------------------------------------- ?Current Treatment: Cycle 4 Taxotere and Cytoxan  ?Labs reviewed ?? ?Crystal Lane is completing her fourth cycle of Taxotere and Cytoxan today.  She has tolerated this well.  She will finish her last cycle and will proceed with breast MRI and surgical appointment to discuss surgical management moving forward. ? ?We discussed her question about the sunscreen.  I recommended sunscreen with fewer ingredients such as the baby sunscreens that have high SPF of 50-70.  I recommended she reapply as necessary. ? ?Crystal Lane will return on Friday for her injection and then on May 8 to touch base with Dr. Lindi Adie about her overall plan. ?

## 2022-03-05 ENCOUNTER — Telehealth: Payer: Self-pay | Admitting: Hematology and Oncology

## 2022-03-05 NOTE — Telephone Encounter (Signed)
Scheduled follow-up appointment per 4/19 los. Patient is aware. ?

## 2022-03-06 ENCOUNTER — Ambulatory Visit: Payer: BC Managed Care – PPO

## 2022-03-06 ENCOUNTER — Other Ambulatory Visit: Payer: Self-pay

## 2022-03-06 ENCOUNTER — Inpatient Hospital Stay: Payer: BC Managed Care – PPO

## 2022-03-06 ENCOUNTER — Encounter: Payer: Self-pay | Admitting: Hematology and Oncology

## 2022-03-06 DIAGNOSIS — C50412 Malignant neoplasm of upper-outer quadrant of left female breast: Secondary | ICD-10-CM

## 2022-03-06 MED ORDER — PEGFILGRASTIM-CBQV 6 MG/0.6ML ~~LOC~~ SOSY
6.0000 mg | PREFILLED_SYRINGE | Freq: Once | SUBCUTANEOUS | Status: AC
  Administered 2022-03-06: 6 mg via SUBCUTANEOUS
  Filled 2022-03-06: qty 0.6

## 2022-03-06 NOTE — Patient Instructions (Signed)

## 2022-03-09 NOTE — Progress Notes (Incomplete)
? ?Patient Care Team: ?Pcp, No as PCP - General ?Verdell Carmine, MD as Consulting Physician (Internal Medicine) ?Mauro Kaufmann, RN as Oncology Nurse Navigator ?Nicholas Lose, MD as Consulting Physician (Hematology and Oncology) ?Rolm Bookbinder, MD as Consulting Physician (General Surgery) ? ?DIAGNOSIS: No diagnosis found. ? ?SUMMARY OF ONCOLOGIC HISTORY: ?Oncology History  ?Malignant neoplasm of upper-outer quadrant of left breast in female, estrogen receptor positive ([facility])  ?03/2010 Miscellaneous  ? BRCA 1 mutation detected at [facility] May 2011 (IVS5--11T>G] ?  ?03/2011 - 08/2011 Neo-Adjuvant Chemotherapy  ? neoadjuvantly with 4 cycles of dose dense doxorubicin and cyclophosphamide followed by 3 of 12 planned doses of weekly paclitaxel, discontinued because of concerns regarding neuropathy ?  ?09/29/2011 Surgery  ? left lumpectomy and sentinel lymph node sampling 09/29/2011 for a residual ypT1c ypN0 invasive ductal carcinoma, grade 3 ?  ?11/2011 - 12/2011 Radiation Therapy  ? adjuvant radiation  ?  ?12/03/2021 Initial Diagnosis  ? Mammogram detected left breast mass measuring 5.2 cm at 12 o'clock position biopsy on 12/03/2021 showed recurrent grade 3 IDC ER+(40%/PR-/Her2-).  Ki-67 40% ?  ?12/31/2021 -  Chemotherapy  ? Patient is on Treatment Plan : BREAST TC q21d  ? ?  ?  ?03/06/2022 Cancer Staging  ? Staging form: Breast, AJCC 8th Edition ?- Clinical: Stage IIIA (cT3, cN0, cM0, G3, ER+, PR-, HER2-) - Signed by Gardenia Phlegm, NP on 03/06/2022 ?Stage prefix: Initial diagnosis ?Histologic grading system: 3 grade system ? ?  ? ? ?CHIEF COMPLIANT: Cycle 5 Taxotere and Cytoxan  ? ?INTERVAL HISTORY: Crystal Lane is a 46 y.o. with above-mentioned history of left breast cancer, currently on chemotherapy with TC. She presents to the clinic today for treatment.  ? ? ?ALLERGIES:  is allergic to lorazepam, oseltamivir, tomato, betadine [povidone iodine], chlorhexidine gluconate, and gelatin. ? ?MEDICATIONS:   ?Current Outpatient Medications  ?Medication Sig Dispense Refill  ? clotrimazole (MYCELEX) 10 MG troche Take 1 tablet (10 mg total) by mouth 5 (five) times daily. 35 tablet 0  ? dexamethasone (DECADRON) 4 MG tablet Take 1 tablet (4 mg total) by mouth daily. Take 1 tablet day before chemo and 1 tablet day after chemo with food 30 tablet 1  ? fluconazole (DIFLUCAN) 200 MG tablet Take 1 tablet (200 mg total) by mouth daily. 7 tablet 0  ? lidocaine-prilocaine (EMLA) cream Apply to affected area once 30 g 3  ? omeprazole (PRILOSEC) 20 MG capsule Take 1 capsule (20 mg total) by mouth daily. 30 capsule 6  ? ondansetron (ZOFRAN) 8 MG tablet Take 1 tablet (8 mg total) by mouth 2 (two) times daily as needed for refractory nausea / vomiting. Start on day 3 after chemo. 30 tablet 1  ? prochlorperazine (COMPAZINE) 10 MG tablet Take 1 tablet (10 mg total) by mouth every 6 (six) hours as needed for nausea or vomiting. 30 tablet 6  ? traZODone (DESYREL) 50 MG tablet Take 1 tablet (50 mg total) by mouth at bedtime as needed for sleep. 30 tablet 1  ? ?No current facility-administered medications for this visit.  ? ? ?PHYSICAL EXAMINATION: ?ECOG PERFORMANCE STATUS: {CHL ONC ECOG CV:[id number]} ? ?There were no vitals filed for this visit. ?There were no vitals filed for this visit. ? ?BREAST:*** No palpable masses or nodules in either right or left breasts. No palpable axillary supraclavicular or infraclavicular adenopathy no breast tenderness or nipple discharge. (exam performed in the presence of a chaperone) ? ?LABORATORY DATA:  ?I have reviewed the data as listed ? ?  Latest Ref Rng & Units 03/04/2022  ? 10:07 AM 02/11/2022  ?  8:11 AM 01/21/2022  ?  9:29 AM  ?CMP  ?Glucose 70 - 99 mg/dL 188   176   162    ?BUN 6 - 20 mg/dL 18   25   18     ?Creatinine 0.44 - 1.00 mg/dL 0.71   0.68   0.59    ?Sodium 135 - 145 mmol/L 138   136   138    ?Potassium 3.5 - 5.1 mmol/L 3.7   3.7   3.6    ?Chloride 98 - 111 mmol/L 105   102   105    ?CO2 22 -  32 mmol/L 26   26   26     ?Calcium 8.9 - 10.3 mg/dL 8.9   9.3   9.3    ?Total Protein 6.5 - 8.1 g/dL 6.7   6.8   6.7    ?Total Bilirubin 0.3 - 1.2 mg/dL 0.3   0.3   0.4    ?Alkaline Phos 38 - 126 U/L 84   93   99    ?AST 15 - 41 U/L 11   11   11     ?ALT 0 - 44 U/L 16   18   20     ? ? ?Lab Results  ?Component Value Date  ? WBC 10.2 03/04/2022  ? HGB 10.1 (L) 03/04/2022  ? HCT 30.1 (L) 03/04/2022  ? MCV 89.9 03/04/2022  ? PLT 374 03/04/2022  ? NEUTROABS 8.4 (H) 03/04/2022  ? ? ?ASSESSMENT & PLAN:  ?No problem-specific Assessment & Plan notes found for this encounter. ? ? ? ?No orders of the defined types were placed in this encounter. ? ?The patient has a good understanding of the overall plan. she agrees with it. she will call with any problems that may develop before the next visit here. ?Total time spent: 30 mins including face to face time and time spent for planning, charting and co-ordination of care ? ? Suzzette Righter, CMA ?03/09/22 ? ? ? I Gardiner Coins am scribing for Dr. Lindi Adie ? ?***  ?

## 2022-03-12 ENCOUNTER — Telehealth: Payer: Self-pay

## 2022-03-12 ENCOUNTER — Inpatient Hospital Stay (HOSPITAL_BASED_OUTPATIENT_CLINIC_OR_DEPARTMENT_OTHER): Payer: BC Managed Care – PPO | Admitting: Physician Assistant

## 2022-03-12 ENCOUNTER — Encounter: Payer: Self-pay | Admitting: Adult Health

## 2022-03-12 ENCOUNTER — Ambulatory Visit
Admission: RE | Admit: 2022-03-12 | Discharge: 2022-03-12 | Disposition: A | Discharge status: Home / Self Care | Payer: BC Managed Care – PPO | Source: Ambulatory Visit | Attending: Hematology and Oncology | Admitting: Hematology and Oncology

## 2022-03-12 VITALS — BP 115/70 | HR 115 | Temp 98.4°F | Resp 18 | Wt 197.3 lb

## 2022-03-12 DIAGNOSIS — C50412 Malignant neoplasm of upper-outer quadrant of left female breast: Secondary | ICD-10-CM | POA: Diagnosis not present

## 2022-03-12 DIAGNOSIS — R21 Rash and other nonspecific skin eruption: Secondary | ICD-10-CM | POA: Diagnosis not present

## 2022-03-12 DIAGNOSIS — Z170 Estrogen receptor positive status [ER+]: Secondary | ICD-10-CM

## 2022-03-12 MED ORDER — PREDNISONE 20 MG PO TABS: 20.0000 mg | ORAL_TABLET | Freq: Every day | ORAL | 0 refills | Status: AC

## 2022-03-12 MED ORDER — FAMOTIDINE 20 MG PO TABS: 20.0000 mg | ORAL_TABLET | Freq: Two times a day (BID) | ORAL | 0 refills | Status: AC

## 2022-03-12 MED ORDER — GADOBUTROL 1 MMOL/ML IV SOLN
9.0000 mL | Freq: Once | INTRAVENOUS | Status: AC | PRN
  Administered 2022-03-12: 9 mL via INTRAVENOUS

## 2022-03-12 NOTE — Progress Notes (Signed)
? ? ? ?Symptom Management Consult note ?[place]   ? ?Patient Care Team: ?Pcp, No as PCP - General ?Verdell Carmine, MD as Consulting Physician (Internal Medicine) ?Mauro Kaufmann, RN as Oncology Nurse Navigator ?Nicholas Lose, MD as Consulting Physician (Hematology and Oncology) ?Rolm Bookbinder, MD as Consulting Physician (General Surgery)  ? ? ?Name of the patient: Crystal Lane  [id number]  07-24-1976  ? ?Date of visit: 03/12/2022  ? ? ?Chief complaint/ Reason for visit- rash ? ?Oncology History  ?Malignant neoplasm of upper-outer quadrant of left breast in female, estrogen receptor positive ([place])  ?03/2010 Miscellaneous  ? BRCA 1 mutation detected at [facility] May 2011 ([id number]] ?  ?03/2011 - 08/2011 Neo-Adjuvant Chemotherapy  ? neoadjuvantly with 4 cycles of dose dense doxorubicin and cyclophosphamide followed by 3 of 12 planned doses of weekly paclitaxel, discontinued because of concerns regarding neuropathy ?  ?09/29/2011 Surgery  ? left lumpectomy and sentinel lymph node sampling 09/29/2011 for a residual ypT1c ypN0 invasive ductal carcinoma, grade 3 ?  ?11/2011 - 12/2011 Radiation Therapy  ? adjuvant radiation  ?  ?12/03/2021 Initial Diagnosis  ? Mammogram detected left breast mass measuring 5.2 cm at 12 o'clock position biopsy on 12/03/2021 showed recurrent grade 3 IDC ER+(40%/PR-/Her2-).  Ki-67 40% ?  ?12/31/2021 -  Chemotherapy  ? Patient is on Treatment Plan : BREAST TC q21d  ? ?  ?  ?03/06/2022 Cancer Staging  ? Staging form: Breast, AJCC 8th Edition ?- Clinical: Stage IIIA (cT3, cN0, cM0, G3, ER+, PR-, HER2-) - Signed by Gardenia Phlegm, NP on 03/06/2022 ?Stage prefix: Initial diagnosis ?Histologic grading system: 3 grade system ? ?  ? ? ?Current Therapy: Cyclophosphamide and docetaxel day 1 cycle 4 on 03/04/22 ? ?Interval history- Crystal Lane is a 46 yo female with oncologic history as above presenting to [facility] today with chief complaint of rash x 1 day.  She states overnight  she noticed itching in her feet.  She did not look at them at that time however when she woke up this morning she noticed she had a rash on her back and extremities.  Rash is still itching.  She took Claritin with transient relief.  She is unsure exactly what caused the rash however states it is similar to allergic reactions she has had in the past. Patient states yesterday afternoon she had a foot detox which has had in the past without any adverse reactions.  She reports soaking her feet with a magnet to draw out toxins and when finished the water was black in color which is suggestive of a heavy detox. She also reports going out to eat yesterday and her mother's sandwich had a tomato on it which she is allergic to, she is unsure if there was cross contamination of their food. She also reports being out in the grass for an extended amount of time while calling her dog, so unsure if that could have caused the rash as well. She denies any new medications, lotions, soaps, laundry detergent. No one in the house has similar rash. She denies fever, chills, cough, shortness of breath, chest pain, abdominal pain, nausea, emesis, swelling. ? ? ? ?ROS  ?All other systems are reviewed and are negative for acute change except as noted in the HPI. ? ? ? ?Allergies  ?Allergen Reactions  ? Lorazepam Other (See Comments)  ?  Depression, inability to form words  ? Oseltamivir   ?  Other reaction(s): GI Intolerance  ? Tomato  Itching and Hives  ? Betadine [Povidone Iodine] Nausea And Vomiting  ?  The smell causes N/V  ? Chlorhexidine Gluconate Rash  ? Gelatin Rash  ?  Ultrasound gel causes pt to have a rash   ? ? ? ?Past Medical History:  ?Diagnosis Date  ? Anxiety   ? due to dx. of breast CA  ? Breast cancer ([place])   ? left  ? Complication of anesthesia   ? excessive talking while sedated  ? GERD (gastroesophageal reflux disease)   ? Seasonal allergies   ? current non-prod. cough, congestion  ? ? ? ?Past Surgical History:   ?Procedure Laterality Date  ? APPENDECTOMY  09/02/2005  ? BREAST BIOPSY Left 12/03/2021  ? BREAST LUMPECTOMY  09/29/2011  ? Procedure: BREAST LUMPECTOMY WITH EXCISION OF SENTINEL NODE;  Surgeon: Shann Medal, MD;  Location: [facility];  Service: General;  Laterality: Left;  Needle [facility] 7:30, SLNbx(nuc med  9:30)  ? BREAST LUMPECTOMY W/ NEEDLE LOCALIZATION  09/29/2011  ? left /SLNBx  ? PORT-A-CATH REMOVAL  10/26/2011  ? Procedure: REMOVAL PORT-A-CATH;  Surgeon: Shann Medal, MD;  Location: [facility];  Service: General;  Laterality: Right;  ? PORTACATH PLACEMENT  04/17/2011  ? PORTACATH PLACEMENT Right 12/25/2021  ? Procedure: INSERTION PORT-A-CATH;  Surgeon: Rolm Bookbinder, MD;  Location: [place];  Service: General;  Laterality: Right;  ? TYMPANOSTOMY TUBE PLACEMENT Bilateral 1980  ? WISDOM TOOTH EXTRACTION    ? ? ?Social History  ? ?Socioeconomic History  ? Marital status: Significant Other  ?  Spouse name: Not on file  ? Number of children: Not on file  ? Years of education: Not on file  ? Highest education level: Not on file  ?Occupational History  ? Not on file  ?Tobacco Use  ? Smoking status: Never  ? Smokeless tobacco: Never  ?Vaping Use  ? Vaping Use: Never used  ?Substance and Sexual Activity  ? Alcohol use: Yes  ?  Comment: rare  ? Drug use: No  ? Sexual activity: Yes  ?Other Topics Concern  ? Not on file  ?Social History Narrative  ? Not on file  ? ?Social Determinants of Health  ? ?Financial Resource Strain: Low Risk   ? Difficulty of Paying Living Expenses: Not hard at all  ?Food Insecurity: No Food Insecurity  ? Worried About Charity fundraiser in the Last Year: Never true  ? Ran Out of Food in the Last Year: Never true  ?Transportation Needs: No Transportation Needs  ? Lack of Transportation (Medical): No  ? Lack of Transportation (Non-Medical): No  ?Physical Activity: Not on file  ?Stress: Not on file  ?Social Connections: Not on file   ?Intimate Partner Violence: Not on file  ? ? ?Family History  ?Problem Relation Age of Onset  ? Diabetes Father   ? Hyperlipidemia Father   ? ? ? ?Current Outpatient Medications:  ?  famotidine (PEPCID) 20 MG tablet, Take 1 tablet (20 mg total) by mouth 2 (two) times daily for 5 days., Disp: 10 tablet, Rfl: 0 ?  predniSONE (DELTASONE) 20 MG tablet, Take 1 tablet (20 mg total) by mouth daily with breakfast for 5 days., Disp: 5 tablet, Rfl: 0 ?  clotrimazole (MYCELEX) 10 MG troche, Take 1 tablet (10 mg total) by mouth 5 (five) times daily., Disp: 35 tablet, Rfl: 0 ?  dexamethasone (DECADRON) 4 MG tablet, Take 1 tablet (4 mg total) by mouth daily.  Take 1 tablet day before chemo and 1 tablet day after chemo with food, Disp: 30 tablet, Rfl: 1 ?  fluconazole (DIFLUCAN) 200 MG tablet, Take 1 tablet (200 mg total) by mouth daily., Disp: 7 tablet, Rfl: 0 ?  lidocaine-prilocaine (EMLA) cream, Apply to affected area once, Disp: 30 g, Rfl: 3 ?  omeprazole (PRILOSEC) 20 MG capsule, Take 1 capsule (20 mg total) by mouth daily., Disp: 30 capsule, Rfl: 6 ?  ondansetron (ZOFRAN) 8 MG tablet, Take 1 tablet (8 mg total) by mouth 2 (two) times daily as needed for refractory nausea / vomiting. Start on day 3 after chemo., Disp: 30 tablet, Rfl: 1 ?  prochlorperazine (COMPAZINE) 10 MG tablet, Take 1 tablet (10 mg total) by mouth every 6 (six) hours as needed for nausea or vomiting., Disp: 30 tablet, Rfl: 6 ?  traZODone (DESYREL) 50 MG tablet, Take 1 tablet (50 mg total) by mouth at bedtime as needed for sleep., Disp: 30 tablet, Rfl: 1 ? ?PHYSICAL EXAM: ?ECOG FS:1 - Symptomatic but completely ambulatory ? ?  ?Vitals:  ? 03/12/22 1304 03/12/22 1306  ?BP: 115/70 115/70  ?Pulse: (!) 115 (!) 115  ?Resp: 18 18  ?Temp: 98.4 ?F (36.9 ?C) 98.4 ?F (36.9 ?C)  ?TempSrc: Oral Oral  ?SpO2: 95% 95%  ?Weight: 197 lb 4.8 oz (89.5 kg) 197 lb 4.8 oz (89.5 kg)  ? ?Physical Exam ?Vitals and nursing note reviewed.  ?Constitutional:   ?   Appearance: She is  well-developed. She is not ill-appearing or toxic-appearing.  ?   Comments: Airway intact. No angioedema  ?HENT:  ?   Head: Normocephalic and atraumatic.  ?   Right Ear: External ear normal.  ?   Left Ear: External ear

## 2022-03-12 NOTE — Telephone Encounter (Signed)
Called pt and offered symptom management appt for 4/27 at 1PM per [place], [place]. Pt accepted and knows to arrive at 1245 for check in.  ?

## 2022-03-14 ENCOUNTER — Other Ambulatory Visit: Payer: Self-pay | Admitting: Physician Assistant

## 2022-03-17 ENCOUNTER — Encounter: Payer: Self-pay | Admitting: *Deleted

## 2022-03-23 ENCOUNTER — Inpatient Hospital Stay: Payer: BC Managed Care – PPO | Admitting: Hematology and Oncology

## 2022-03-23 ENCOUNTER — Inpatient Hospital Stay: Payer: BC Managed Care – PPO

## 2022-03-24 ENCOUNTER — Telehealth: Payer: Self-pay | Admitting: *Deleted

## 2022-03-24 NOTE — Telephone Encounter (Signed)
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 Physician team notified. ?

## 2022-03-27 ENCOUNTER — Encounter: Payer: Self-pay | Admitting: *Deleted

## 2022-05-05 ENCOUNTER — Other Ambulatory Visit: Payer: Self-pay | Admitting: Nurse Practitioner

## 2022-09-10 ENCOUNTER — Encounter: Payer: Self-pay | Admitting: Genetic Counselor

## 2022-09-10 DIAGNOSIS — Z1501 Genetic susceptibility to malignant neoplasm of breast: Secondary | ICD-10-CM | POA: Insufficient documentation

## 2023-02-03 IMAGING — MR MR BREAST BILAT WO/W CM
8 of 12 series · 31 of 48 positions shown · IV contrast (9ml gadavist)
Comparison: Recent mammography

CLINICAL DATA: The patient has a history of upper inner left breast
cancer, lumpectomy, radiation, and chemotherapy in 1071. In November 2021, the patient felt a lump in the left breast. Biopsy of the mass
felt by the patient demonstrated recurrent invasive ductal
carcinoma. Patient presents for MRI prior to preoperative systemic
therapy.

EXAM:
BILATERAL BREAST MRI WITH AND WITHOUT CONTRAST
TECHNIQUE: Multiplanar, multisequence MR images of both breasts were obtained
prior to and following the intravenous administration of 9 ml of
Gadavist

[Series 2: t2_tirm_tra ipat (a-p) · axial · 3.0mm · 0.78mm/px · 1 of 60 slices shown]
[im 1/60]
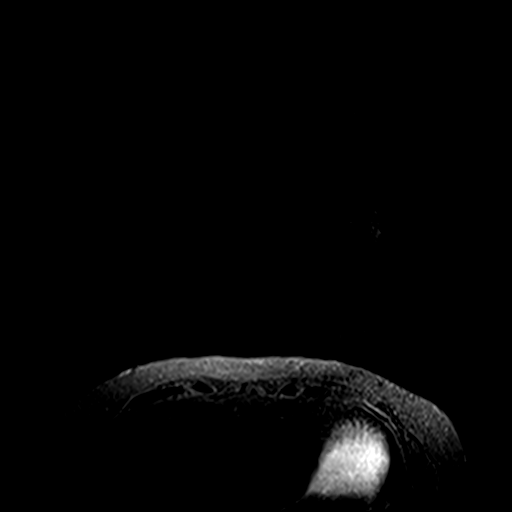

[Series 3: fl3d pre-cm no · axial · non-contrast · 1.2mm · 1.04mm/px · z∈[-87,+104]mm · 5 of 160 slices shown]
[im 1/160]
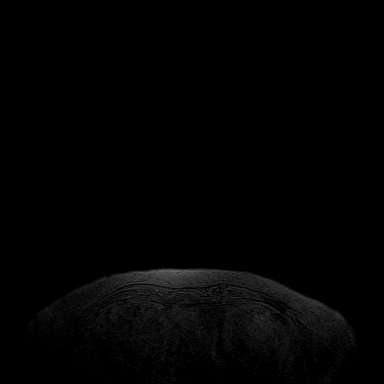
[im 40/160]
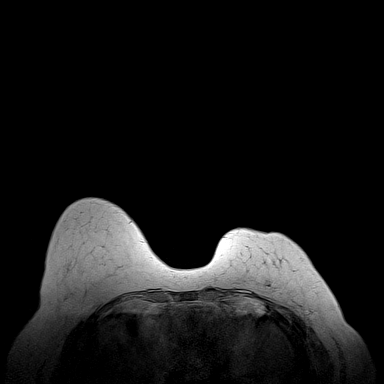
[im 80/160]
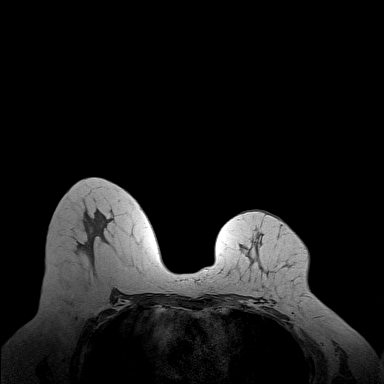
[im 120/160]
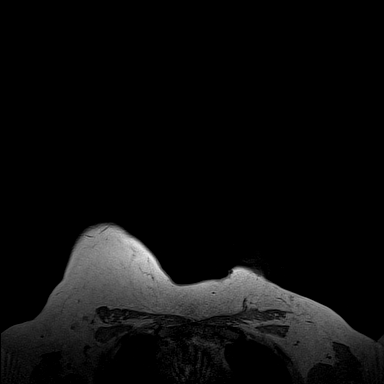
[im 160/160]
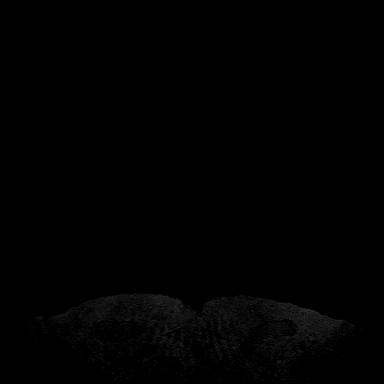

[Series 4: fl3d pre-cm · axial · non-contrast · 1.2mm · 1.04mm/px · z∈[-87,+104]mm · 5 of 160 slices shown]
[im 1/160]
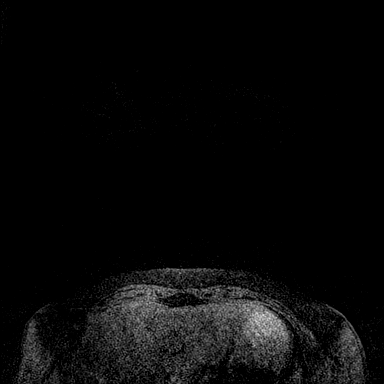
[im 40/160]
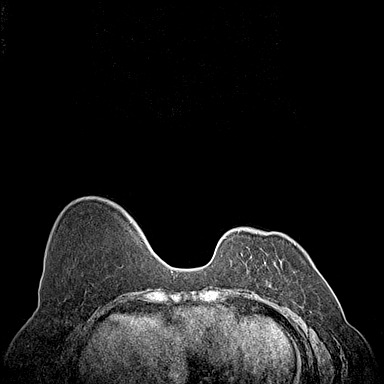
[im 80/160]
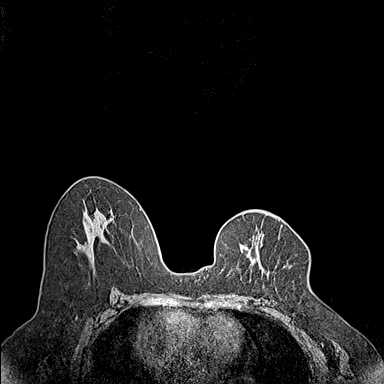
[im 120/160]
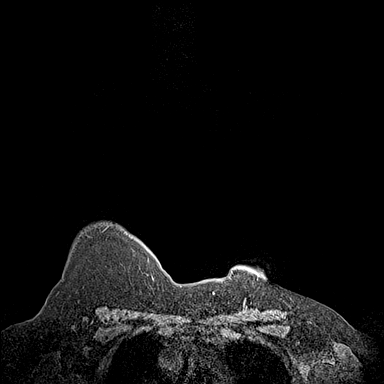
[im 160/160]
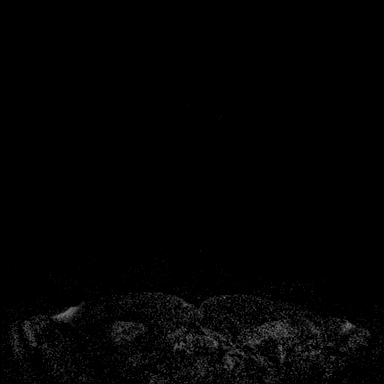

[Series 5: fl3d post-cm 20 · axial · 1.2mm · 1.04mm/px · z∈[-87,+104]mm · 5 of 160 slices shown (1 of 3)]
[im 1/160]
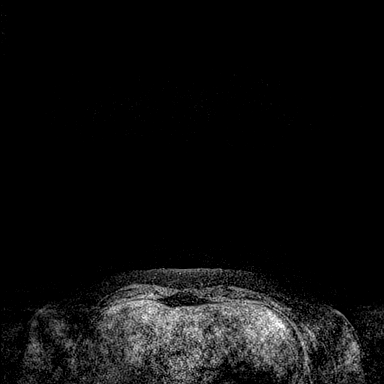
[im 40/160]
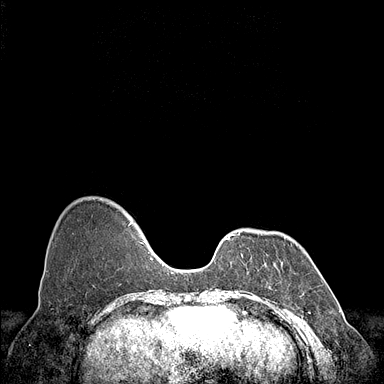
[im 80/160]
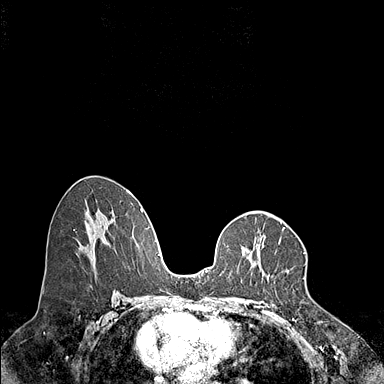
[im 120/160]
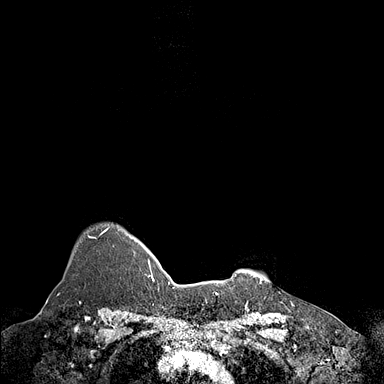
[im 160/160]
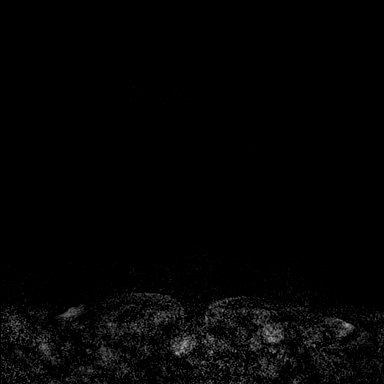

[Series 6: fl3d post-cm 20 · axial · 1.2mm · 1.04mm/px · z∈[-87,+104]mm · 5 of 160 slices shown (2 of 3)]
[im 1/160]
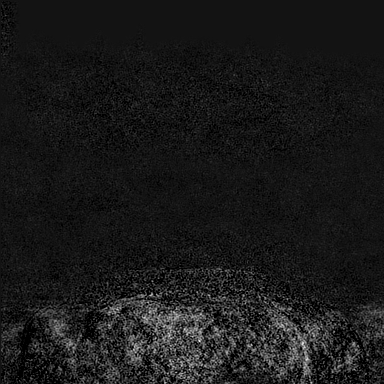
[im 40/160]
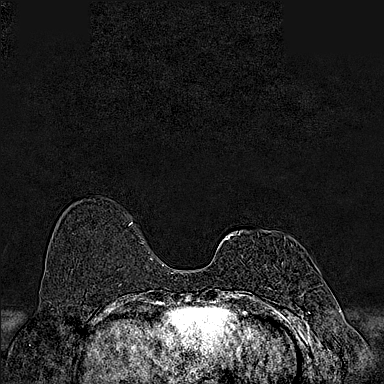
[im 80/160]
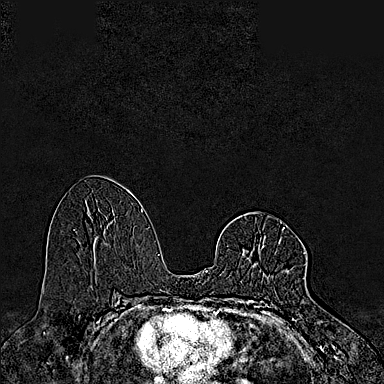
[im 120/160]
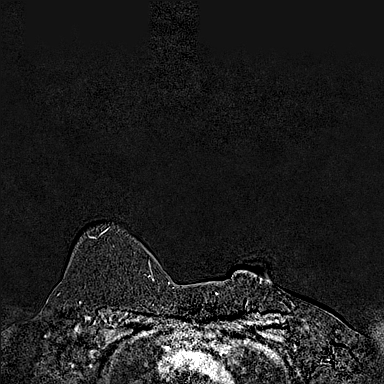
[im 160/160]
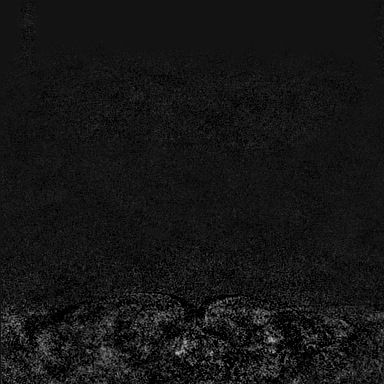

[Series 7: fl3d post-cm 20 · axial · 192.0mm · 1.04mm/px · 1 of 1 slices shown (3 of 3)]
[im 1/1]
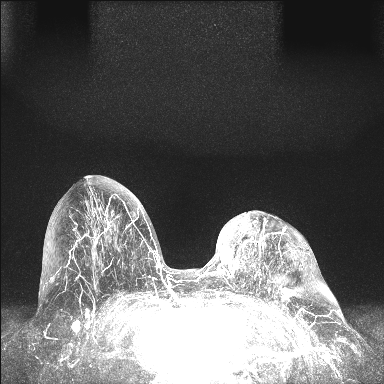

[Series 8: fl3d post-cm 3 · axial · 1.2mm · 1.04mm/px · z∈[-87,+104]mm · 6 of 160 slices shown (1 of 2)]
[im 1/160]
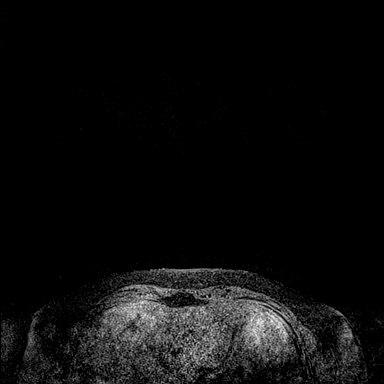
[im 32/160]
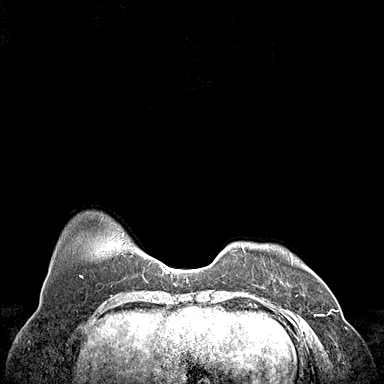
[im 64/160]
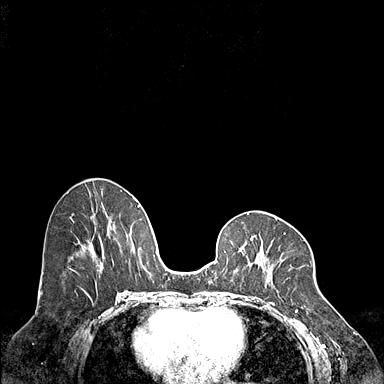
[im 96/160]
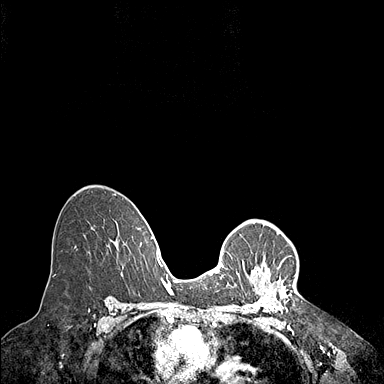
[im 128/160]
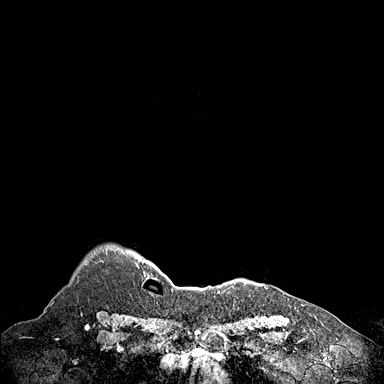
[im 160/160]
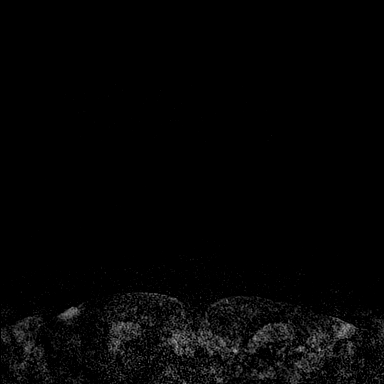

[Series 9: fl3d post-cm 3 · axial · 1.2mm · 1.04mm/px · z∈[-87,-11]mm · 3 of 160 slices shown (2 of 2)]
[im 1/160]
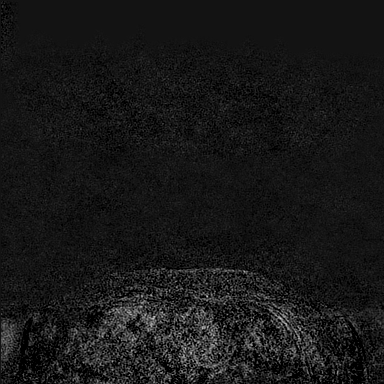
[im 32/160]
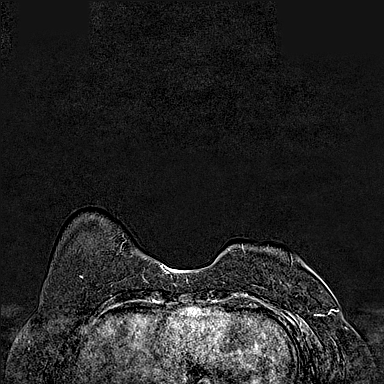
[im 64/160]
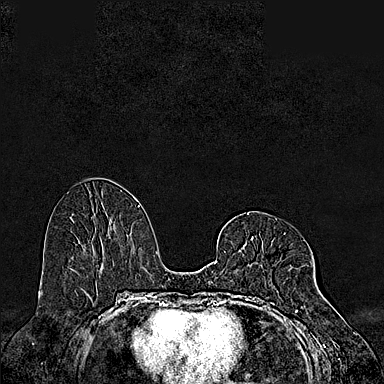

[31 of 48 positions shown; findings below may reference images not displayed]

Three-dimensional MR images were rendered by post-processing of the
original MR data on an independent workstation. The
three-dimensional MR images were interpreted, and findings are
reported in the following complete MRI report for this study. Three
dimensional images were evaluated at the independent interpreting
workstation using the DynaCAD thin client.
FINDINGS: Breast composition: b. Scattered fibroglandular tissue.

Background parenchymal enhancement: Mild

Right breast: No mass or abnormal enhancement.

Left breast: The patient's known malignancy in the left breast
measuring 5.2 x 3.5 x 4.2 cm sonographically is identified on
today's MRI. The mass is centered between 12 and 1 o'clock. The mass
abuts the pectoralis muscle. There is no clear fat plane between the
mass and the pectoralis muscle. The mass also extends to the skin.
This mass measures 4.3 by 3.8 by 4.0 cm in AP, transverse, and
craniocaudal dimensions. The mass demonstrates low-grade
enhancement, with persistent kinetics. The most marked enhancement
is at the site where the mass abuts the skin and where the mass
abuts the pectoralis muscle. Enhancement throughout the remainder of
the mass is low-grade. There is a lymph node adjacent to the mass
which is normal in size and morphology.

Lymph nodes: No abnormal appearing lymph nodes.

Ancillary findings:  None.
IMPRESSION: 1. The patient's known malignancy is identified on today's MRI in
the left breast between 12 and 1 o'clock. On ultrasound, this mass
measured 5.2 x 3.5 x 4.2 cm. On today's MRI, the mass measures 4.3 x
3.8 x 4.0 cm with low-grade persistent enhancement kinetics. The
mass extends from the skin surface to the pectoralis muscle. There
is tethering of the overlying skin and skin involvement is not
excluded. Pectoralis involvement cannot be excluded or confirmed on
this study. There is not a fat plane between the mass and the
pectoralis muscle as the mass abuts the muscle.
2. No other abnormalities identified on today's study.

RECOMMENDATION:
Recommend continued surgical and oncologic follow up.

BI-RADS CATEGORY  6: Known biopsy-proven malignancy.
